# Patient Record
Sex: Male | Born: 1945 | Race: White | Hispanic: No | Smoking: Former smoker
Health system: Southern US, Community
[De-identification: ages and names within clinical notes are randomized; demographics above are authoritative.]

## PROBLEM LIST (undated history)

## (undated) DIAGNOSIS — E785 Hyperlipidemia, unspecified: Secondary | ICD-10-CM

## (undated) DIAGNOSIS — I428 Other cardiomyopathies: Secondary | ICD-10-CM

## (undated) DIAGNOSIS — I519 Heart disease, unspecified: Secondary | ICD-10-CM

## (undated) DIAGNOSIS — I10 Essential (primary) hypertension: Secondary | ICD-10-CM

## (undated) DIAGNOSIS — I442 Atrioventricular block, complete: Secondary | ICD-10-CM

## (undated) HISTORY — DX: Essential (primary) hypertension: I10

## (undated) HISTORY — DX: Heart disease, unspecified: I51.9

## (undated) HISTORY — DX: Hyperlipidemia, unspecified: E78.5

## (undated) HISTORY — DX: Atrioventricular block, complete: I44.2

## (undated) HISTORY — DX: Other cardiomyopathies: I42.8

---

## 2000-01-20 HISTORY — PX: PACEMAKER IMPLANT: EP1218

## 2010-12-16 HISTORY — PX: PACEMAKER GENERATOR CHANGE: SHX5998

## 2015-01-31 DIAGNOSIS — E785 Hyperlipidemia, unspecified: Secondary | ICD-10-CM | POA: Insufficient documentation

## 2015-01-31 DIAGNOSIS — Z45018 Encounter for adjustment and management of other part of cardiac pacemaker: Secondary | ICD-10-CM

## 2015-01-31 DIAGNOSIS — I442 Atrioventricular block, complete: Secondary | ICD-10-CM | POA: Insufficient documentation

## 2015-01-31 DIAGNOSIS — I10 Essential (primary) hypertension: Secondary | ICD-10-CM

## 2015-01-31 DIAGNOSIS — Z950 Presence of cardiac pacemaker: Secondary | ICD-10-CM | POA: Diagnosis not present

## 2015-01-31 HISTORY — DX: Essential (primary) hypertension: I10

## 2015-01-31 HISTORY — DX: Hyperlipidemia, unspecified: E78.5

## 2015-01-31 HISTORY — DX: Encounter for adjustment and management of other part of cardiac pacemaker: Z45.018

## 2015-02-28 DIAGNOSIS — Z45010 Encounter for checking and testing of cardiac pacemaker pulse generator [battery]: Secondary | ICD-10-CM | POA: Diagnosis not present

## 2015-04-23 DIAGNOSIS — Z9181 History of falling: Secondary | ICD-10-CM | POA: Diagnosis not present

## 2015-04-23 DIAGNOSIS — Z6828 Body mass index (BMI) 28.0-28.9, adult: Secondary | ICD-10-CM | POA: Diagnosis not present

## 2015-04-23 DIAGNOSIS — Z950 Presence of cardiac pacemaker: Secondary | ICD-10-CM | POA: Diagnosis not present

## 2015-04-23 DIAGNOSIS — I10 Essential (primary) hypertension: Secondary | ICD-10-CM | POA: Diagnosis not present

## 2015-04-23 DIAGNOSIS — M1990 Unspecified osteoarthritis, unspecified site: Secondary | ICD-10-CM | POA: Diagnosis not present

## 2015-04-23 DIAGNOSIS — M8580 Other specified disorders of bone density and structure, unspecified site: Secondary | ICD-10-CM | POA: Diagnosis not present

## 2015-04-23 DIAGNOSIS — E559 Vitamin D deficiency, unspecified: Secondary | ICD-10-CM | POA: Diagnosis not present

## 2015-04-23 DIAGNOSIS — E663 Overweight: Secondary | ICD-10-CM | POA: Diagnosis not present

## 2015-04-23 DIAGNOSIS — E785 Hyperlipidemia, unspecified: Secondary | ICD-10-CM | POA: Diagnosis not present

## 2015-05-22 DIAGNOSIS — Z950 Presence of cardiac pacemaker: Secondary | ICD-10-CM | POA: Diagnosis not present

## 2015-05-27 DIAGNOSIS — K5730 Diverticulosis of large intestine without perforation or abscess without bleeding: Secondary | ICD-10-CM | POA: Diagnosis not present

## 2015-05-27 DIAGNOSIS — K635 Polyp of colon: Secondary | ICD-10-CM | POA: Diagnosis not present

## 2015-05-27 DIAGNOSIS — Z1211 Encounter for screening for malignant neoplasm of colon: Secondary | ICD-10-CM | POA: Diagnosis not present

## 2015-05-27 DIAGNOSIS — D124 Benign neoplasm of descending colon: Secondary | ICD-10-CM | POA: Diagnosis not present

## 2015-08-22 DIAGNOSIS — Z950 Presence of cardiac pacemaker: Secondary | ICD-10-CM | POA: Diagnosis not present

## 2015-09-09 DIAGNOSIS — H2513 Age-related nuclear cataract, bilateral: Secondary | ICD-10-CM | POA: Diagnosis not present

## 2015-10-23 DIAGNOSIS — Z139 Encounter for screening, unspecified: Secondary | ICD-10-CM | POA: Diagnosis not present

## 2015-10-23 DIAGNOSIS — Z79899 Other long term (current) drug therapy: Secondary | ICD-10-CM | POA: Diagnosis not present

## 2015-10-23 DIAGNOSIS — E785 Hyperlipidemia, unspecified: Secondary | ICD-10-CM | POA: Diagnosis not present

## 2015-10-23 DIAGNOSIS — Z0000 Encounter for general adult medical examination without abnormal findings: Secondary | ICD-10-CM | POA: Diagnosis not present

## 2015-10-23 DIAGNOSIS — Z125 Encounter for screening for malignant neoplasm of prostate: Secondary | ICD-10-CM | POA: Diagnosis not present

## 2015-10-23 DIAGNOSIS — M1990 Unspecified osteoarthritis, unspecified site: Secondary | ICD-10-CM | POA: Diagnosis not present

## 2015-10-23 DIAGNOSIS — Z950 Presence of cardiac pacemaker: Secondary | ICD-10-CM | POA: Diagnosis not present

## 2015-10-23 DIAGNOSIS — Z6827 Body mass index (BMI) 27.0-27.9, adult: Secondary | ICD-10-CM | POA: Diagnosis not present

## 2015-10-23 DIAGNOSIS — E559 Vitamin D deficiency, unspecified: Secondary | ICD-10-CM | POA: Diagnosis not present

## 2015-10-23 DIAGNOSIS — Z23 Encounter for immunization: Secondary | ICD-10-CM | POA: Diagnosis not present

## 2015-10-23 DIAGNOSIS — I10 Essential (primary) hypertension: Secondary | ICD-10-CM | POA: Diagnosis not present

## 2016-02-17 DIAGNOSIS — I10 Essential (primary) hypertension: Secondary | ICD-10-CM | POA: Diagnosis not present

## 2016-02-17 DIAGNOSIS — Z45018 Encounter for adjustment and management of other part of cardiac pacemaker: Secondary | ICD-10-CM | POA: Diagnosis not present

## 2016-02-17 DIAGNOSIS — E785 Hyperlipidemia, unspecified: Secondary | ICD-10-CM | POA: Diagnosis not present

## 2016-02-17 DIAGNOSIS — I442 Atrioventricular block, complete: Secondary | ICD-10-CM | POA: Diagnosis not present

## 2016-02-20 DIAGNOSIS — Z950 Presence of cardiac pacemaker: Secondary | ICD-10-CM | POA: Diagnosis not present

## 2016-02-27 DIAGNOSIS — E785 Hyperlipidemia, unspecified: Secondary | ICD-10-CM | POA: Diagnosis not present

## 2016-02-27 DIAGNOSIS — E663 Overweight: Secondary | ICD-10-CM | POA: Diagnosis not present

## 2016-02-27 DIAGNOSIS — M8580 Other specified disorders of bone density and structure, unspecified site: Secondary | ICD-10-CM | POA: Diagnosis not present

## 2016-02-27 DIAGNOSIS — Z950 Presence of cardiac pacemaker: Secondary | ICD-10-CM | POA: Diagnosis not present

## 2016-02-27 DIAGNOSIS — M1990 Unspecified osteoarthritis, unspecified site: Secondary | ICD-10-CM | POA: Diagnosis not present

## 2016-02-27 DIAGNOSIS — Z6828 Body mass index (BMI) 28.0-28.9, adult: Secondary | ICD-10-CM | POA: Diagnosis not present

## 2016-02-27 DIAGNOSIS — E559 Vitamin D deficiency, unspecified: Secondary | ICD-10-CM | POA: Diagnosis not present

## 2016-02-27 DIAGNOSIS — I10 Essential (primary) hypertension: Secondary | ICD-10-CM | POA: Diagnosis not present

## 2016-02-27 DIAGNOSIS — R739 Hyperglycemia, unspecified: Secondary | ICD-10-CM | POA: Diagnosis not present

## 2016-03-12 DIAGNOSIS — I442 Atrioventricular block, complete: Secondary | ICD-10-CM | POA: Diagnosis not present

## 2016-03-23 DIAGNOSIS — I442 Atrioventricular block, complete: Secondary | ICD-10-CM | POA: Diagnosis not present

## 2016-03-26 DIAGNOSIS — I420 Dilated cardiomyopathy: Secondary | ICD-10-CM | POA: Insufficient documentation

## 2016-03-26 HISTORY — DX: Dilated cardiomyopathy: I42.0

## 2016-04-02 DIAGNOSIS — I459 Conduction disorder, unspecified: Secondary | ICD-10-CM | POA: Diagnosis not present

## 2016-04-02 DIAGNOSIS — I420 Dilated cardiomyopathy: Secondary | ICD-10-CM | POA: Diagnosis not present

## 2016-04-06 DIAGNOSIS — Z45018 Encounter for adjustment and management of other part of cardiac pacemaker: Secondary | ICD-10-CM | POA: Diagnosis not present

## 2016-04-06 DIAGNOSIS — I420 Dilated cardiomyopathy: Secondary | ICD-10-CM | POA: Diagnosis not present

## 2016-04-06 DIAGNOSIS — I442 Atrioventricular block, complete: Secondary | ICD-10-CM | POA: Diagnosis not present

## 2016-04-06 DIAGNOSIS — I10 Essential (primary) hypertension: Secondary | ICD-10-CM | POA: Diagnosis not present

## 2016-04-15 DIAGNOSIS — I420 Dilated cardiomyopathy: Secondary | ICD-10-CM | POA: Diagnosis not present

## 2016-04-15 DIAGNOSIS — I442 Atrioventricular block, complete: Secondary | ICD-10-CM | POA: Diagnosis not present

## 2016-04-15 DIAGNOSIS — E785 Hyperlipidemia, unspecified: Secondary | ICD-10-CM | POA: Diagnosis not present

## 2016-04-15 DIAGNOSIS — Z45018 Encounter for adjustment and management of other part of cardiac pacemaker: Secondary | ICD-10-CM | POA: Diagnosis not present

## 2016-04-15 DIAGNOSIS — I10 Essential (primary) hypertension: Secondary | ICD-10-CM | POA: Diagnosis not present

## 2016-04-20 DIAGNOSIS — I420 Dilated cardiomyopathy: Secondary | ICD-10-CM | POA: Diagnosis not present

## 2016-04-22 DIAGNOSIS — I442 Atrioventricular block, complete: Secondary | ICD-10-CM | POA: Diagnosis not present

## 2016-04-22 DIAGNOSIS — I420 Dilated cardiomyopathy: Secondary | ICD-10-CM | POA: Diagnosis not present

## 2016-04-28 DIAGNOSIS — I442 Atrioventricular block, complete: Secondary | ICD-10-CM | POA: Diagnosis not present

## 2016-04-28 DIAGNOSIS — I340 Nonrheumatic mitral (valve) insufficiency: Secondary | ICD-10-CM | POA: Diagnosis not present

## 2016-04-28 DIAGNOSIS — I517 Cardiomegaly: Secondary | ICD-10-CM | POA: Diagnosis not present

## 2016-04-28 DIAGNOSIS — I5030 Unspecified diastolic (congestive) heart failure: Secondary | ICD-10-CM | POA: Diagnosis not present

## 2016-04-28 DIAGNOSIS — I420 Dilated cardiomyopathy: Secondary | ICD-10-CM | POA: Diagnosis not present

## 2016-05-21 DIAGNOSIS — Z950 Presence of cardiac pacemaker: Secondary | ICD-10-CM | POA: Diagnosis not present

## 2016-06-22 DIAGNOSIS — Z45018 Encounter for adjustment and management of other part of cardiac pacemaker: Secondary | ICD-10-CM | POA: Diagnosis not present

## 2016-06-22 DIAGNOSIS — I420 Dilated cardiomyopathy: Secondary | ICD-10-CM | POA: Diagnosis not present

## 2016-06-22 DIAGNOSIS — I442 Atrioventricular block, complete: Secondary | ICD-10-CM | POA: Diagnosis not present

## 2016-06-22 DIAGNOSIS — E785 Hyperlipidemia, unspecified: Secondary | ICD-10-CM | POA: Diagnosis not present

## 2016-06-22 DIAGNOSIS — I10 Essential (primary) hypertension: Secondary | ICD-10-CM | POA: Diagnosis not present

## 2016-06-29 DIAGNOSIS — E785 Hyperlipidemia, unspecified: Secondary | ICD-10-CM | POA: Diagnosis not present

## 2016-06-29 DIAGNOSIS — Z950 Presence of cardiac pacemaker: Secondary | ICD-10-CM | POA: Diagnosis not present

## 2016-06-29 DIAGNOSIS — Z1389 Encounter for screening for other disorder: Secondary | ICD-10-CM | POA: Diagnosis not present

## 2016-06-29 DIAGNOSIS — Z6827 Body mass index (BMI) 27.0-27.9, adult: Secondary | ICD-10-CM | POA: Diagnosis not present

## 2016-06-29 DIAGNOSIS — I10 Essential (primary) hypertension: Secondary | ICD-10-CM | POA: Diagnosis not present

## 2016-06-29 DIAGNOSIS — Z79899 Other long term (current) drug therapy: Secondary | ICD-10-CM | POA: Diagnosis not present

## 2016-06-29 DIAGNOSIS — E559 Vitamin D deficiency, unspecified: Secondary | ICD-10-CM | POA: Diagnosis not present

## 2016-06-29 DIAGNOSIS — M8580 Other specified disorders of bone density and structure, unspecified site: Secondary | ICD-10-CM | POA: Diagnosis not present

## 2016-06-29 DIAGNOSIS — E663 Overweight: Secondary | ICD-10-CM | POA: Diagnosis not present

## 2016-06-29 DIAGNOSIS — Z9181 History of falling: Secondary | ICD-10-CM | POA: Diagnosis not present

## 2016-10-14 ENCOUNTER — Ambulatory Visit (INDEPENDENT_AMBULATORY_CARE_PROVIDER_SITE_OTHER): Payer: PPO | Admitting: Cardiology

## 2016-10-14 ENCOUNTER — Encounter: Payer: Self-pay | Admitting: Cardiology

## 2016-10-14 VITALS — BP 150/90 | HR 60 | Resp 12 | Ht 71.0 in | Wt 187.0 lb

## 2016-10-14 DIAGNOSIS — E785 Hyperlipidemia, unspecified: Secondary | ICD-10-CM | POA: Diagnosis not present

## 2016-10-14 DIAGNOSIS — I10 Essential (primary) hypertension: Secondary | ICD-10-CM

## 2016-10-14 DIAGNOSIS — I420 Dilated cardiomyopathy: Secondary | ICD-10-CM

## 2016-10-14 DIAGNOSIS — I442 Atrioventricular block, complete: Secondary | ICD-10-CM | POA: Diagnosis not present

## 2016-10-14 DIAGNOSIS — I081 Rheumatic disorders of both mitral and tricuspid valves: Secondary | ICD-10-CM | POA: Diagnosis not present

## 2016-10-14 DIAGNOSIS — I425 Other restrictive cardiomyopathy: Secondary | ICD-10-CM | POA: Diagnosis not present

## 2016-10-14 NOTE — Progress Notes (Signed)
Cardiology Office Note:    Date:  10/14/2016   ID:  Alvin Lang, DOB 05-23-1945, MRN [id number]  PCP:  Nicholos Johns, MD  Cardiologist:  Jenne Campus, MD    Referring MD: Nicholos Johns, MD   Chief Complaint  Patient presents with  . Second opinion on ICD placement  he is doing great asymptomatic  History of Present Illness:    Alvin Lang is a 71 y.o. male  That have been taking care of for last few years. Recently his echo cardiac M showed diminished ejection fraction. Apparently was repeated and showed ejection fraction of 25%. He started having discussion about potentially biventricular pacing or defibrillator.He would like to see me back to get second opinion regarding that issue. 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 No chest pain no tightness no pressure burning in the chest, no shortness of breath, no dizziness no syncope.  Past Medical History:  Diagnosis Date  . Hyperlipidemia   . Hypertension     Past Surgical History:  Procedure Laterality Date  . INSERT / REPLACE / REMOVE PACEMAKER     [employer] Jude    Current Medications: Current Meds  Medication Sig  . aspirin EC 81 MG tablet Take 81 mg by mouth daily.  . Calcium Carb-Cholecalciferol (CALCIUM-VITAMIN D) 500-200 MG-UNIT tablet Take 1 tablet by mouth 2 (two) times daily.  . carvedilol (COREG) 6.25 MG tablet Take 6.25 mg by mouth 2 (two) times daily.  . DOCOSAHEXAENOIC ACID PO Take 2 g by mouth daily.  . ramipril (ALTACE) 10 MG capsule Take 10 mg by mouth daily.  . ramipril (ALTACE) 5 MG capsule Take 5 mg by mouth daily.     Allergies:   Patient has no known allergies.   Social History   Social History  . Marital status: Married    Spouse name: N/A  . Number of children: N/A  . Years of education: N/A   Social History Main Topics  . Smoking status: Former Research scientist (life sciences)  . Smokeless  tobacco: Never Used  . Alcohol use No  . Drug use: No  . Sexual activity: Not Asked   Other Topics Concern  . None   Social History Narrative  . None     Family History: The patient's family history includes Diabetes in his father; Heart attack in his mother; Heart disease in his father. ROS:   Please see the history of present illness.    All 14 point review of systems negative except as described per history of present illness  EKGs/Labs/Other Studies Reviewed:      Recent Labs: No results found for requested labs within last 8760 hours.  Recent Lipid Panel No results found for: CHOL, TRIG, HDL, CHOLHDL, VLDL, LDLCALC, LDLDIRECT  Physical Exam:    VS:  BP (!) 150/90   Pulse 60   Resp 12   Ht 5\' 11"  (1.803 m)   Wt 187 lb (84.8 kg)   BMI 26.08 kg/m     Wt Readings from Last 3 Encounters:  10/14/16 187 lb (84.8 kg)     GEN:  Well nourished, well developed in no acute distress HEENT: Normal NECK: No JVD; No carotid bruits LYMPHATICS: No lymphadenopathy CARDIAC: RRR, no murmurs, no rubs, no gallops RESPIRATORY:  Clear to auscultation without rales, wheezing or rhonchi  ABDOMEN: Soft, non-tender, non-distended MUSCULOSKELETAL:  No edema; No  deformity  SKIN: Warm and dry LOWER EXTREMITIES: no swelling NEUROLOGIC:  Alert and oriented x 3 PSYCHIATRIC:  Normal affect   ASSESSMENT:    1. Dilated cardiomyopathy ([place])   2. Complete heart block ([place])   3. Dyslipidemia   4. Essential hypertension    PLAN:    In order of problems listed above:  1. Dilated coronary myopath He is on  Inhibitor as well as beta blocker. I will check his Chem-7 today To see if I increased dose of ACE inhibitor. I will ask him to have echocardiogram to reassess his left ventricular ejection fraction. Iritis spoke to him about defibrillator, biventricular pacing and he is agreeable to proceed that way they be needs to do that. I still think there is some role to improve his medical  therapy. He can benefit from Aldactone and I will put him on it and based on the results of his Chem-7. 2. Complete heat block: Addressed with dual-chamber pacemaker 3. Essential hypertension: Blood pressure well-controlled. 4. Dyslipidemia: On statin which I'll continue  We'll check Chem-7 as well as proBNP and echocardiogram today.   Medication Adjustments/Labs and Tests Ordered: Current medicines are reviewed at length with the patient today.  Concerns regarding medicines are outlined above.  Orders Placed This Encounter  Procedures  . Basic metabolic panel  . Pro b natriuretic peptide (BNP)  . ECHOCARDIOGRAM COMPLETE   Medication changes: No orders of the defined types were placed in this encounter.   Signed, Park Liter, MD, [facility] 10/14/2016 11:40 AM    [place]

## 2016-10-14 NOTE — Patient Instructions (Addendum)
Medication Instructions:  Your physician recommends that you continue on your current medications as directed. Please refer to the Current Medication list given to you today.  Labwork: Your physician recommends that you have lab work today to check your kidney function and fluid level.   Testing/Procedures: Your physician has requested that you have an echocardiogram. Echocardiography is a painless test that uses sound waves to create images of your heart. It provides your doctor with information about the size and shape of your heart and how well your heart's chambers and valves are working. This procedure takes approximately one hour. There are no restrictions for this procedure.   Follow-Up: Your physician recommends that you schedule a follow-up appointment in: 2-3 weeks  Any Other Special Instructions Will Be Listed Below (If Applicable).  Please note that any paperwork needing to be filled out by the provider will need to be addressed at the front desk prior to seeing the provider. Please note that any paperwork FMLA, Disability or other documents regarding health condition is subject to a $25.00 charge that must be received prior to completion of paperwork in the form of a money order or check.    If you need a refill on your cardiac medications before your next appointment, please call your pharmacy.

## 2016-10-15 ENCOUNTER — Other Ambulatory Visit: Payer: Self-pay

## 2016-10-15 DIAGNOSIS — I420 Dilated cardiomyopathy: Secondary | ICD-10-CM

## 2016-10-15 LAB — BASIC METABOLIC PANEL
BUN/Creatinine Ratio: 17 (ref 10–24)
BUN: 15 mg/dL (ref 8–27)
CO2: 23 mmol/L (ref 20–29)
CREATININE: 0.86 mg/dL (ref 0.76–1.27)
Calcium: 9.2 mg/dL (ref 8.6–10.2)
Chloride: 104 mmol/L (ref 96–106)
GFR, EST AFRICAN AMERICAN: 99 mL/min/{1.73_m2} (ref >59)
GFR, EST NON AFRICAN AMERICAN: 86 mL/min/{1.73_m2} (ref >59)
Glucose: 103 mg/dL — ABNORMAL HIGH (ref 65–99)
Potassium: 4.4 mmol/L (ref 3.5–5.2)
SODIUM: 141 mmol/L (ref 134–144)

## 2016-10-15 LAB — PRO B NATRIURETIC PEPTIDE: NT-Pro BNP: 365 pg/mL (ref 0–376)

## 2016-10-15 MED ORDER — CARVEDILOL 12.5 MG PO TABS: 12.5000 mg | ORAL_TABLET | Freq: Two times a day (BID) | ORAL | 11 refills | Status: DC

## 2016-10-29 DIAGNOSIS — Z6828 Body mass index (BMI) 28.0-28.9, adult: Secondary | ICD-10-CM | POA: Diagnosis not present

## 2016-10-29 DIAGNOSIS — Z139 Encounter for screening, unspecified: Secondary | ICD-10-CM | POA: Diagnosis not present

## 2016-10-29 DIAGNOSIS — Z23 Encounter for immunization: Secondary | ICD-10-CM | POA: Diagnosis not present

## 2016-10-29 DIAGNOSIS — Z950 Presence of cardiac pacemaker: Secondary | ICD-10-CM | POA: Diagnosis not present

## 2016-10-29 DIAGNOSIS — I10 Essential (primary) hypertension: Secondary | ICD-10-CM | POA: Diagnosis not present

## 2016-10-29 DIAGNOSIS — Z79899 Other long term (current) drug therapy: Secondary | ICD-10-CM | POA: Diagnosis not present

## 2016-10-29 DIAGNOSIS — E559 Vitamin D deficiency, unspecified: Secondary | ICD-10-CM | POA: Diagnosis not present

## 2016-10-29 DIAGNOSIS — E785 Hyperlipidemia, unspecified: Secondary | ICD-10-CM | POA: Diagnosis not present

## 2016-10-29 DIAGNOSIS — Z9181 History of falling: Secondary | ICD-10-CM | POA: Diagnosis not present

## 2016-10-29 DIAGNOSIS — E663 Overweight: Secondary | ICD-10-CM | POA: Diagnosis not present

## 2016-11-09 ENCOUNTER — Encounter: Payer: Self-pay | Admitting: Cardiology

## 2016-11-09 ENCOUNTER — Ambulatory Visit (INDEPENDENT_AMBULATORY_CARE_PROVIDER_SITE_OTHER): Payer: PPO | Admitting: Cardiology

## 2016-11-09 ENCOUNTER — Other Ambulatory Visit: Payer: Self-pay

## 2016-11-09 VITALS — BP 160/80 | HR 56 | Resp 12 | Ht 71.0 in | Wt 187.0 lb

## 2016-11-09 DIAGNOSIS — Z45018 Encounter for adjustment and management of other part of cardiac pacemaker: Secondary | ICD-10-CM

## 2016-11-09 DIAGNOSIS — I442 Atrioventricular block, complete: Secondary | ICD-10-CM

## 2016-11-09 DIAGNOSIS — E785 Hyperlipidemia, unspecified: Secondary | ICD-10-CM

## 2016-11-09 DIAGNOSIS — I10 Essential (primary) hypertension: Secondary | ICD-10-CM | POA: Diagnosis not present

## 2016-11-09 DIAGNOSIS — I420 Dilated cardiomyopathy: Secondary | ICD-10-CM

## 2016-11-09 MED ORDER — RAMIPRIL 10 MG PO CAPS: 10.0000 mg | ORAL_CAPSULE | Freq: Every day | ORAL | 3 refills | Status: DC

## 2016-11-09 MED ORDER — SPIRONOLACTONE 25 MG PO TABS: 12.5000 mg | ORAL_TABLET | Freq: Every day | ORAL | 11 refills | Status: DC

## 2016-11-09 NOTE — Progress Notes (Signed)
Cardiology Office Note:    Date:  11/09/2016   ID:  Alvin Lang, DOB [birth year], MRN [id number]  PCP:  Nicholos Johns, MD  Cardiologist:  Jenne Campus, MD    Referring MD: Nicholos Johns, MD   Chief Complaint  Patient presents with  . 3 week follow up  I am doing well  History of Present Illness:    Alvin Lang is a 71 y.o. male  with recently recognize significant cardiomyopathy. He was offered ICD with biventricular pacing but is reluctant to do it he came to me for second opinion that told him that biventricular pacer with ICD will be reasonable thing to do but he wanted to try medication first to see if he can improve his left ventricular ejection fraction. Recently I increased the dose of beta blocker. Today I will discontinue 5 mg of ramipril at evening time and put him on 12.5 mg of Aldactone. We'll check his Chem-7 within next week. I will also interrogate his device today to make sure there is no significant arrhythmia. I seen back in my office about one month and at that time we'll repeat his echocardiogram. If there is no improvement then we'll talk about replacing his device to BiV ICD.  Past Medical History:  Diagnosis Date  . Hyperlipidemia   . Hypertension     Past Surgical History:  Procedure Laterality Date  . INSERT / REPLACE / REMOVE PACEMAKER     [employer]. Jude    Current Medications: Current Meds  Medication Sig  . aspirin EC 81 MG tablet Take 81 mg by mouth daily.  . Calcium Carb-Cholecalciferol (CALCIUM-VITAMIN D) 500-200 MG-UNIT tablet Take 1 tablet by mouth 2 (two) times daily.  . carvedilol (COREG) 12.5 MG tablet Take 1 tablet (12.5 mg total) by mouth 2 (two) times daily.  . DOCOSAHEXAENOIC ACID PO Take 2 g by mouth daily.  . ramipril (ALTACE) 10 MG capsule Take 10 mg by mouth daily.  . ramipril (ALTACE) 5 MG capsule Take 5 mg by mouth daily.  . Vitamin D, Ergocalciferol, (DRISDOL) 50000 units CAPS capsule Take 50,000 Units by mouth every 7 (seven)  days.     Allergies:   Patient has no known allergies.   Social History   Social History  . Marital status: Married    Spouse name: N/A  . Number of children: N/A  . Years of education: N/A   Social History Main Topics  . Smoking status: Former Research scientist (life sciences)  . Smokeless tobacco: Never Used  . Alcohol use No  . Drug use: No  . Sexual activity: Not Asked   Other Topics Concern  . None   Social History Narrative  . None     Family History: The patient's family history includes Diabetes in his father; Heart attack in his mother; Heart disease in his father. ROS:   Please see the history of present illness.    All 14 point review of systems negative except as described per history of present illness  EKGs/Labs/Other Studies Reviewed:      Recent Labs: 10/14/2016: BUN 15; Creatinine, Ser 0.86; NT-Pro BNP 365; Potassium 4.4; Sodium 141  Recent Lipid Panel No results found for: CHOL, TRIG, HDL, CHOLHDL, VLDL, LDLCALC, LDLDIRECT  Physical Exam:    VS:  BP (!) 160/80   Pulse (!) 56   Resp 12   Ht 5\' 11"  (1.803 m)   Wt 187 lb (84.8 kg)   BMI 26.08 kg/m     Wt Readings  from Last 3 Encounters:  11/09/16 187 lb (84.8 kg)  10/14/16 187 lb (84.8 kg)     GEN:  Well nourished, well developed in no acute distress HEENT: Normal NECK: No JVD; No carotid bruits LYMPHATICS: No lymphadenopathy CARDIAC: RRR, no murmurs, no rubs, no gallops RESPIRATORY:  Clear to auscultation without rales, wheezing or rhonchi  ABDOMEN: Soft, non-tender, non-distended MUSCULOSKELETAL:  No edema; No deformity  SKIN: Warm and dry LOWER EXTREMITIES: no swelling NEUROLOGIC:  Alert and oriented x 3 PSYCHIATRIC:  Normal affect   ASSESSMENT:    1. Complete heart block ([place])   2. Dilated cardiomyopathy ([place])   3. Essential hypertension   4. Dyslipidemia   5. Pacemaker reprogramming/check    PLAN:    In order of problems listed above:  1. Complete heart block: He does have a  pacemaker 2. Dilated cardio myopathy: Intensifying his medical therapy 3. Essential hypertension: Blood pressure is elevated today but hopefully with medications will improve 4. Dyslipidemia: Continue with statin 5. Pacemaker will recheck today for any arrhythmias  Interrogation of the pacemaker revealed no evidence of fast ventricular events.   Medication Adjustments/Labs and Tests Ordered: Current medicines are reviewed at length with the patient today.  Concerns regarding medicines are outlined above.  No orders of the defined types were placed in this encounter.  Medication changes: No orders of the defined types were placed in this encounter.   Signed, Park Liter, MD, [facility] 11/09/2016 10:04 AM    [place]

## 2016-11-09 NOTE — Patient Instructions (Addendum)
Medication Instructions:  Your physician has recommended you make the following change in your medication:  1.) STOP Ramipril 5 mg in the evening  and only take Ramipril 10 mg once daily.  2.) START Aldactone 12.5 mg daily.   1. Avoid all over-the-counter antihistamines except Claritin/Loratadine and Zyrtec/Cetrizine. 2. Avoid all combination including cold sinus allergies flu decongestant and sleep medications 3. You can use Robitussin DM Mucinex and Mucinex DM for cough. 4. can use Tylenol aspirin ibuprofen and naproxen but no combinations such as sleep or sinus.  Labwork: Your physician recommends that you return for lab work in: 1 weeks  Testing/Procedures: Environmental manager in office today.   Follow-Up: Your physician recommends that you schedule a follow-up appointment in: 1 month  Any Other Special Instructions Will Be Listed Below (If Applicable).  Please note that any paperwork needing to be filled out by the provider will need to be addressed at the front desk prior to seeing the provider. Please note that any paperwork FMLA, Disability or other documents regarding health condition is subject to a $25.00 charge that must be received prior to completion of paperwork in the form of a money order or check.     If you need a refill on your cardiac medications before your next appointment, please call your pharmacy.

## 2016-11-10 DIAGNOSIS — I420 Dilated cardiomyopathy: Secondary | ICD-10-CM | POA: Diagnosis not present

## 2016-11-10 DIAGNOSIS — I442 Atrioventricular block, complete: Secondary | ICD-10-CM | POA: Diagnosis not present

## 2016-11-10 DIAGNOSIS — E785 Hyperlipidemia, unspecified: Secondary | ICD-10-CM | POA: Diagnosis not present

## 2016-11-10 LAB — BASIC METABOLIC PANEL
BUN / CREAT RATIO: 17 (ref 10–24)
BUN: 15 mg/dL (ref 8–27)
CO2: 23 mmol/L (ref 20–29)
CREATININE: 0.87 mg/dL (ref 0.76–1.27)
Calcium: 9.2 mg/dL (ref 8.6–10.2)
Chloride: 103 mmol/L (ref 96–106)
GFR, EST AFRICAN AMERICAN: 99 mL/min/{1.73_m2} (ref >59)
GFR, EST NON AFRICAN AMERICAN: 86 mL/min/{1.73_m2} (ref >59)
Glucose: 88 mg/dL (ref 65–99)
Potassium: 4.2 mmol/L (ref 3.5–5.2)
Sodium: 138 mmol/L (ref 134–144)

## 2016-11-17 ENCOUNTER — Ambulatory Visit (INDEPENDENT_AMBULATORY_CARE_PROVIDER_SITE_OTHER): Payer: PPO | Admitting: Cardiology

## 2016-11-17 ENCOUNTER — Encounter: Payer: Self-pay | Admitting: Cardiology

## 2016-11-17 ENCOUNTER — Ambulatory Visit (HOSPITAL_BASED_OUTPATIENT_CLINIC_OR_DEPARTMENT_OTHER)
Admission: RE | Admit: 2016-11-17 | Discharge: 2016-11-17 | Disposition: A | Discharge status: Home / Self Care | Payer: PPO | Source: Ambulatory Visit | Attending: Cardiology | Admitting: Cardiology

## 2016-11-17 VITALS — BP 174/94 | HR 56 | Resp 12 | Ht 71.0 in | Wt 186.0 lb

## 2016-11-17 DIAGNOSIS — I420 Dilated cardiomyopathy: Secondary | ICD-10-CM | POA: Diagnosis not present

## 2016-11-17 DIAGNOSIS — E785 Hyperlipidemia, unspecified: Secondary | ICD-10-CM | POA: Diagnosis not present

## 2016-11-17 DIAGNOSIS — I10 Essential (primary) hypertension: Secondary | ICD-10-CM

## 2016-11-17 DIAGNOSIS — I442 Atrioventricular block, complete: Secondary | ICD-10-CM | POA: Diagnosis not present

## 2016-11-17 DIAGNOSIS — I081 Rheumatic disorders of both mitral and tricuspid valves: Secondary | ICD-10-CM | POA: Insufficient documentation

## 2016-11-17 MED ORDER — CARVEDILOL 25 MG PO TABS: 25.0000 mg | ORAL_TABLET | Freq: Two times a day (BID) | ORAL | 1 refills | Status: DC

## 2016-11-17 NOTE — Patient Instructions (Signed)
Medication Instructions:  Your physician has recommended you make the following change in your medication: Your Carvedilol has been increased to 25 mg twice daily. This has been sent your pharmacy.  Labwork: None ordered  Testing/Procedures: None ordered  Follow-Up: Your physician recommends that you schedule a follow-up appointment in: 1 month with Dr. Agustin Cree  You will be contacted by EP to see Dr. Curt Bears.   Any Other Special Instructions Will Be Listed Below (If Applicable).     If you need a refill on your cardiac medications before your next appointment, please call your pharmacy.

## 2016-11-17 NOTE — Progress Notes (Signed)
Cardiology Office Note:    Date:  11/17/2016   ID:  Alvin Lang, DOB [birth year], MRN [id number]  PCP:  Nicholos Johns, MD  Cardiologist:  Jenne Campus, MD    Referring MD: Nicholos Johns, MD   Chief Complaint  Patient presents with  . Follow up Echo  To talk about echocardiogram  History of Present Illness:    Alvin Lang is a 71 y.o. 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  He is coming today to my office for follow-up.  He already had discussion about potentially doing biventricular pacing with potentially defibrillator.  I improved his medication with hopes to improve his left ventricular ejection fraction but in spite of medication being appropriate his ejection fraction still below 35%.  On top of that looking at echocardiogram I do not see anything of the myocardium and I clearly can see asynchronicity of LV.  We spent all visit today talking about options and the situation I think we reached the point that some intervention need to be done.  Basically facing to options of upgrading his dual-chamber pacemaker to biventricular pacing with hope of improvement in his left ventricular ejection fraction to about 35%.  And if that is the case defibrillator will not be needed.  That will involve putting extra LV lead.  Alternative to do so option would be to put defibrillator with biventricular pacing.  In this situation will be forced to put extra ventricular lead for defibrillator as well as a LV lead.  I will refer him to EP team for consultation to help transfer discretion and proceed if patient elected to do so.  So far he is telling me that he is willing to do it and he is favoring defibrillator with extra LV lead and extra defibrillator lead.  His blood pressure is elevated today, therefore I will double the dose of carvedilol.  Past Medical History:  Diagnosis Date  .  Hyperlipidemia   . Hypertension     Past Surgical History:  Procedure Laterality Date  . INSERT / REPLACE / REMOVE PACEMAKER     [employer] Jude    Current Medications: Current Meds  Medication Sig  . aspirin EC 81 MG tablet Take 81 mg by mouth daily.  . Calcium Carb-Cholecalciferol (CALCIUM-VITAMIN D) 500-200 MG-UNIT tablet Take 1 tablet by mouth 2 (two) times daily.  . DOCOSAHEXAENOIC ACID PO Take 2 g by mouth daily.  . ramipril (ALTACE) 10 MG capsule Take 1 capsule (10 mg total) by mouth daily.  Marland Kitchen spironolactone (ALDACTONE) 25 MG tablet Take 0.5 tablets (12.5 mg total) by mouth daily.  . Vitamin D, Ergocalciferol, (DRISDOL) 50000 units CAPS capsule Take 50,000 Units by mouth every 7 (seven) days.  . [DISCONTINUED] carvedilol (COREG) 12.5 MG tablet Take 1 tablet (12.5 mg total) by mouth 2 (two) times daily.     Allergies:   Patient has no known allergies.   Social History   Social History  . Marital status: Married    Spouse name: N/A  . Number of children: N/A  . Years of education: N/A   Social History Main Topics  . Smoking status: Former Research scientist (life sciences)  . Smokeless tobacco: Never Used  . Alcohol use No  . Drug use: No  . Sexual activity: Not Asked   Other Topics Concern  . None   Social History Narrative  . None     Family History: The patient's  family history includes Diabetes in his father; Heart attack in his mother; Heart disease in his father. ROS:   Please see the history of present illness.    All 14 point review of systems negative except as described per history of present illness  EKGs/Labs/Other Studies Reviewed:      Recent Labs: 10/14/2016: NT-Pro BNP 365 11/10/2016: BUN 15; Creatinine, Ser 0.87; Potassium 4.2; Sodium 138  Recent Lipid Panel No results found for: CHOL, TRIG, HDL, CHOLHDL, VLDL, LDLCALC, LDLDIRECT  Physical Exam:    VS:  BP (!) 174/94   Pulse (!) 56   Resp 12   Ht 5\' 11"  (1.803 m)   Wt 186 lb (84.4 kg)   BMI 25.94 kg/m     Wt  Readings from Last 3 Encounters:  11/17/16 186 lb (84.4 kg)  11/09/16 187 lb (84.8 kg)  10/14/16 187 lb (84.8 kg)     GEN:  Well nourished, well developed in no acute distress HEENT: Normal NECK: No JVD; No carotid bruits LYMPHATICS: No lymphadenopathy CARDIAC: RRR, no murmurs, no rubs, no gallops RESPIRATORY:  Clear to auscultation without rales, wheezing or rhonchi  ABDOMEN: Soft, non-tender, non-distended MUSCULOSKELETAL:  No edema; No deformity  SKIN: Warm and dry LOWER EXTREMITIES: no swelling NEUROLOGIC:  Alert and oriented x 3 PSYCHIATRIC:  Normal affect   ASSESSMENT:    1. Complete heart block ([place])   2. Dilated cardiomyopathy ([place])   3. Essential hypertension   4. Dyslipidemia    PLAN:    In order of problems listed above:  1. Complete heart block: Addressed with a pacemaker. 2. Dilated cardiomyopathy with ACE inhibitor, beta-blocker, Aldactone.  I will increase the dose of beta-blocker. 3. Essential hypertension: She will be taking care of by increasing dose of beta-blocker. 4. Dyslipidemia: Statin which I continue   Medication Adjustments/Labs and Tests Ordered: Current medicines are reviewed at length with the patient today.  Concerns regarding medicines are outlined above.  No orders of the defined types were placed in this encounter.  Medication changes:  Meds ordered this encounter  Medications  . carvedilol (COREG) 25 MG tablet    Sig: Take 1 tablet (25 mg total) by mouth 2 (two) times daily.    Dispense:  180 tablet    Refill:  1    Signed, Park Liter, MD, [facility] 11/17/2016 11:12 AM    [place]

## 2016-11-17 NOTE — Progress Notes (Signed)
  Echocardiogram 2D Echocardiogram has been performed.  Alvin Lang 11/17/2016, 10:14 AM

## 2016-11-19 DIAGNOSIS — Z45010 Encounter for checking and testing of cardiac pacemaker pulse generator [battery]: Secondary | ICD-10-CM | POA: Diagnosis not present

## 2016-11-23 ENCOUNTER — Ambulatory Visit (HOSPITAL_COMMUNITY)
Admission: RE | Admit: 2016-11-23 | Discharge: 2016-11-23 | Disposition: A | Discharge status: Home / Self Care | Payer: PPO | Source: Ambulatory Visit | Attending: Internal Medicine | Admitting: Internal Medicine

## 2016-11-23 ENCOUNTER — Ambulatory Visit (INDEPENDENT_AMBULATORY_CARE_PROVIDER_SITE_OTHER): Payer: PPO | Admitting: Internal Medicine

## 2016-11-23 ENCOUNTER — Encounter: Payer: Self-pay | Admitting: Internal Medicine

## 2016-11-23 VITALS — BP 162/88 | HR 60 | Ht 71.0 in | Wt 186.8 lb

## 2016-11-23 DIAGNOSIS — I420 Dilated cardiomyopathy: Secondary | ICD-10-CM

## 2016-11-23 DIAGNOSIS — Z45018 Encounter for adjustment and management of other part of cardiac pacemaker: Secondary | ICD-10-CM

## 2016-11-23 DIAGNOSIS — R0602 Shortness of breath: Secondary | ICD-10-CM

## 2016-11-23 DIAGNOSIS — I442 Atrioventricular block, complete: Secondary | ICD-10-CM | POA: Diagnosis not present

## 2016-11-23 NOTE — Patient Instructions (Addendum)
Medication Instructions:  Your physician recommends that you continue on your current medications as directed. Please refer to the Current Medication list given to you today.  -- If you need a refill on your cardiac medications before your next appointment, please call your pharmacy. --  Labwork: None ordered  Testing/Procedures: A chest x-ray takes a picture of the organs and structures inside the chest, including the heart, lungs, and blood vessels. This test can show several things, including, whether the heart is enlarges; whether fluid is building up in the lungs; and whether pacemaker / defibrillator leads are still in place.  Follow-Up: Your physician wants you to follow-up in: 1 year with Dr. Rayann Heman.  You will receive a reminder letter in the mail two months in advance. If you don't receive a letter, please call our office to schedule the follow-up appointment.  Remote monitoring is used to monitor your Pacemaker  from home. This monitoring reduces the number of office visits required to check your device to one time per year. It allows [place] to keep an eye on the functioning of your device to ensure it is working properly. You are scheduled for a device check from home on 02/22/2017. You may send your transmission at any time that day. If you have a wireless device, the transmission will be sent automatically. After your physician reviews your transmission, you will receive a postcard with your next transmission date.    Thank you for choosing [facility]!!   Frederik Schmidt, RN [phone]  Any Other Special Instructions Will Be Listed Below (If Applicable).

## 2016-11-23 NOTE — Progress Notes (Signed)
Electrophysiology Office Note   Date:  11/23/2016   ID:  DEROLD DORSCH, DOB 02/07/1945, MRN [id number]  PCP:  Nicholos Johns, MD  Cardiologist:  Dr Agustin Cree Primary Electrophysiologist: previously Dr Ola Spurr  CC: CHF   History of Present Illness: Alvin Lang is a 71 y.o. male who presents today for electrophysiology evaluation.   The patient is referred by Dr Agustin Cree for EP consultation regarding his pacemaker and concerns for pacemaker mediated cardiomyopathy.  The patient carries a history of complete heart block.  He initially underwent PPM implantation 2002.  He has done well since his pacemaker was implanted. He underwent generator change in 2012.  He has reduced EF but with NYHA Class II symptoms.  He has SOB with moderate activity.  He has most recently been followed by Dr Ola Spurr.  The patient has been treated with good medicines but continues to have a depressed EF.  There have been conversations about upgrade to CRT-D.  The patient presents today for further EP opinion.  He remains reasonably active.  Today, he denies symptoms of palpitations, chest pain,  orthopnea, PND, lower extremity edema, claudication, dizziness, presyncope, syncope, bleeding, or neurologic sequela. The patient is tolerating medications without difficulties and is otherwise without complaint today.    Past Medical History:  Diagnosis Date  . Chronic systolic dysfunction of left ventricle   . Complete heart block ([place])   . Hyperlipidemia   . Hypertension   . Nonischemic cardiomyopathy [facility])    Past Surgical History:  Procedure Laterality Date  . PACEMAKER GENERATOR CHANGE Left 12/16/2010   SJM [facility] DR RF PPM for complete heart block,  generator change by Dr Agustin Cree  . PACEMAKER IMPLANT  2002   by Dr Agustin Cree     Current Outpatient Medications  Medication Sig Dispense Refill  . aspirin EC 81 MG tablet Take 81 mg by mouth daily.    . Calcium Carb-Cholecalciferol  (CALCIUM-VITAMIN D) 500-200 MG-UNIT tablet Take 1 tablet by mouth 2 (two) times daily.    . carvedilol (COREG) 25 MG tablet Take 1 tablet (25 mg total) by mouth 2 (two) times daily. 180 tablet 1  . DOCOSAHEXAENOIC ACID PO Take 2 g by mouth daily.    . ramipril (ALTACE) 10 MG capsule Take 1 capsule (10 mg total) by mouth daily. 90 capsule 3  . spironolactone (ALDACTONE) 25 MG tablet Take 0.5 tablets (12.5 mg total) by mouth daily. 30 tablet 11  . Vitamin D, Ergocalciferol, (DRISDOL) 50000 units CAPS capsule Take 50,000 Units by mouth every 7 (seven) days.     No current facility-administered medications for this visit.     Allergies:   Patient has no known allergies.   Social History:  The patient  reports that he has quit smoking. he has never used smokeless tobacco. He reports that he does not drink alcohol or use drugs.   Family History:  The patient's family history includes Diabetes in his father; Heart attack in his mother; Heart disease in his father.    ROS:  Please see the history of present illness.   All other systems are personally reviewed and negative.    PHYSICAL EXAM: VS:  BP (!) 162/88   Pulse 60   Ht 5\' 11"  (1.803 m)   Wt 186 lb 12.8 oz (84.7 kg)   SpO2 97%   BMI 26.05 kg/m  , BMI Body mass index is 26.05 kg/m.  GEN: Well nourished, well developed, in no acute distress  HEENT: normal  Neck: no JVD, carotid bruits, or masses Cardiac: RRR; no murmurs, rubs, or gallops,no edema  Respiratory:  clear to auscultation bilaterally, normal work of breathing GI: soft, nontender, nondistended, + BS MS: no deformity or atrophy  Skin: warm and dry, device pocket is well healed Neuro:  Strength and sensation are intact Psych: euthymic mood, full affect  EKG:  EKG is ordered today. The ekg ordered today is personally reviewed and shows AV paced rhythm  Prior Echo is reviewed and reveals EF 30% with severe MR  Device interrogation is personally reviewed today in detail.   See PaceArt for details.   Recent Labs: 10/14/2016: NT-Pro BNP 365 11/10/2016: BUN 15; Creatinine, Ser 0.87; Potassium 4.2; Sodium 138  personally reviewed   Lipid Panel  No results found for: CHOL, TRIG, HDL, CHOLHDL, VLDL, LDLCALC, LDLDIRECT personally reviewed   Wt Readings from Last 3 Encounters:  11/23/16 186 lb 12.8 oz (84.7 kg)  11/17/16 186 lb (84.4 kg)  11/09/16 187 lb (84.8 kg)      Other studies Reviewed: Additional studies/ records that were personally reviewed today include: Dr Wendy Poet notes, Dr Blane Ohara prior notes  Review of the above records today demonstrates: as above   ASSESSMENT AND PLAN:  1.  Complete heart block The patient has prior PPM implantation for CHB.  He has depressed EF (30%) despite good medical therapy.  There is concern for pacemaker induced cardiomyopathy as he V paces > 40%.  At this time, he meets criteria for BIV ICD upgrade for primary prevention of sudden death and treatment of his cardiomyopathy.  Risks, benefits, alternatives to BiV ICD upgrade were discussed in detail with the patient today. We did discuss that his risks are likely increased given 2 prior devices, complete heart block, and implant time of 16 years.  I agree with Dr Ola Spurr that further medical optimization is also reasonable.  Given his BP today is elevated, I think that entresto would be a good option for him.  He wishes to discuss this further when he sees Dr Agustin Cree in a few weeks. Will obtain CXR to evaluate current device leads. He will contact my office if he decides to proceed with device upgrade. Otherwise, I will follow remotely and see him back in a year.  2. Nonischemic CM/ chronic systolic dysfunction CXR as above Consider entresto as above  Follow-up:  Merlin Return to see me in a year  Current medicines are reviewed at length with the patient today.   The patient does not have concerns regarding his medicines.  The following changes were  made today:  none  Labs/ tests ordered today include:  Orders Placed This Encounter  Procedures  . EKG 12-Lead     Signed, Thompson Grayer, MD  11/23/2016 9:49 AM     [facility] [address] [place] [employer] [address] [phone] (office) [phone] (fax)

## 2016-11-24 ENCOUNTER — Encounter: Payer: Self-pay | Admitting: Internal Medicine

## 2016-11-24 NOTE — Telephone Encounter (Signed)
This encounter was created in error - please disregard.

## 2016-11-24 NOTE — Telephone Encounter (Signed)
New message ° ° ° ° °Patient returning call for results. Please call °

## 2016-11-26 LAB — CUP PACEART INCLINIC DEVICE CHECK
Brady Statistic RA Percent Paced: 32 %
Implantable Lead Implant Date: 20020201
Implantable Lead Location: 753859
Implantable Pulse Generator Implant Date: 20121127
Lead Channel Impedance Value: 237.5 Ohm
Lead Channel Impedance Value: 800 Ohm
Lead Channel Pacing Threshold Pulse Width: 0.4 ms
Lead Channel Setting Pacing Amplitude: 1.125
Lead Channel Setting Pacing Amplitude: 2 V
Lead Channel Setting Sensing Sensitivity: 5 mV
MDC IDC LEAD IMPLANT DT: 20020201
MDC IDC LEAD LOCATION: 753860
MDC IDC MSMT BATTERY REMAINING LONGEVITY: 73 mo
MDC IDC MSMT BATTERY VOLTAGE: 2.89 V
MDC IDC MSMT LEADCHNL RA PACING THRESHOLD AMPLITUDE: 1 V
MDC IDC MSMT LEADCHNL RA PACING THRESHOLD PULSEWIDTH: 0.4 ms
MDC IDC MSMT LEADCHNL RA SENSING INTR AMPL: 3.2 mV
MDC IDC MSMT LEADCHNL RV PACING THRESHOLD AMPLITUDE: 0.75 V
MDC IDC PG SERIAL: 7288642
MDC IDC SESS DTM: 20181105152313
MDC IDC SET LEADCHNL RV PACING PULSEWIDTH: 0.4 ms
MDC IDC STAT BRADY RV PERCENT PACED: 99.69 %

## 2017-01-22 ENCOUNTER — Ambulatory Visit (INDEPENDENT_AMBULATORY_CARE_PROVIDER_SITE_OTHER): Payer: PPO | Admitting: Cardiology

## 2017-01-22 ENCOUNTER — Encounter: Payer: Self-pay | Admitting: Cardiology

## 2017-01-22 VITALS — BP 130/82 | HR 51 | Ht 71.0 in | Wt 187.8 lb

## 2017-01-22 DIAGNOSIS — I10 Essential (primary) hypertension: Secondary | ICD-10-CM

## 2017-01-22 DIAGNOSIS — I442 Atrioventricular block, complete: Secondary | ICD-10-CM | POA: Diagnosis not present

## 2017-01-22 DIAGNOSIS — I420 Dilated cardiomyopathy: Secondary | ICD-10-CM

## 2017-01-22 DIAGNOSIS — E785 Hyperlipidemia, unspecified: Secondary | ICD-10-CM

## 2017-01-22 MED ORDER — SACUBITRIL-VALSARTAN 24-26 MG PO TABS: 1.0000 | ORAL_TABLET | Freq: Two times a day (BID) | ORAL | 1 refills | Status: DC

## 2017-01-22 NOTE — Patient Instructions (Signed)
Medication Instructions:  Your physician has recommended you make the following change in your medication:  1) Stop your Ramipril 2) After stopping your Ramipril for 3 days start on your Entresto 1 tablet twice daily  Labwork: None ordered  Testing/Procedures: None ordered  Follow-Up: Your physician recommends that you schedule a follow-up appointment in: 1 month with Dr. Agustin Cree   Any Other Special Instructions Will Be Listed Below (If Applicable).     If you need a refill on your cardiac medications before your next appointment, please call your pharmacy.

## 2017-01-22 NOTE — Addendum Note (Signed)
Addended by: Mattie Marlin on: 01/22/2017 09:19 AM   Modules accepted: Orders

## 2017-01-22 NOTE — Progress Notes (Signed)
Cardiology Office Note:    Date:  01/22/2017   ID:  Alvin Lang, DOB 12/31/1945, MRN [id number]  PCP:  Alvin Johns, MD  Cardiologist:  Alvin Campus, MD    Referring MD: Alvin Johns, MD   Chief Complaint  Patient presents with  . 1 month follow up  I am doing well  History of Present Illness:    Alvin Lang is a 72 y.o. male with history of cardiomyopathy which is nonischemic.  We contemplating upgrading his device and pacing system to by V pacing with ICD.  Apparently he initially saw Dr. Ola Lang to however wanted second opinion came back to me.  I referred him to our EP team.  He did see Dr. Rayann Lang who recommended by V pacing with ICD however patient is very reluctant and actually understanding that the patient had is the fact that he is high risk of having this procedure.  t he is not keen on proceeding with a biventricular pacing with ICD.  Therefore, we will try to switch him to [facility] and increase the dose to appropriate 1 and see if there be any improvement left ventricular ejection fraction.  I asked him to stop ramipril 3 days later to start [facility].  Back in my office in 1 month  Past Medical History:  Diagnosis Date  . Chronic systolic dysfunction of left ventricle   . Complete heart block ([place])   . Hyperlipidemia   . Hypertension   . Nonischemic cardiomyopathy [facility])     Past Surgical History:  Procedure Laterality Date  . PACEMAKER GENERATOR CHANGE Left 12/16/2010   SJM [facility] DR RF PPM for complete heart block,  generator change by Dr Alvin Lang  . PACEMAKER IMPLANT  2002   by Dr Alvin Lang    Current Medications: Current Meds  Medication Sig  . aspirin EC 81 MG tablet Take 81 mg by mouth daily.  Marland Kitchen atorvastatin (LIPITOR) 10 MG tablet Take 10 mg by mouth daily.  . Calcium Carb-Cholecalciferol (CALCIUM-VITAMIN D) 500-200 MG-UNIT tablet Take 1 tablet by mouth 2 (two) times daily.  . carvedilol (COREG) 25 MG tablet Take 1 tablet (25 mg total) by  mouth 2 (two) times daily.  . DOCOSAHEXAENOIC ACID PO Take 2 g by mouth daily.  . ramipril (ALTACE) 10 MG capsule Take 1 capsule (10 mg total) by mouth daily.  Marland Kitchen spironolactone (ALDACTONE) 25 MG tablet Take 0.5 tablets (12.5 mg total) by mouth daily.  . Vitamin D, Ergocalciferol, (DRISDOL) 50000 units CAPS capsule Take 50,000 Units by mouth every 7 (seven) days.     Allergies:   Patient has no known allergies.   Social History   Socioeconomic History  . Marital status: Married    Spouse name: None  . Number of children: None  . Years of education: None  . Highest education level: None  Social Needs  . Financial resource strain: None  . Food insecurity - worry: None  . Food insecurity - inability: None  . Transportation needs - medical: None  . Transportation needs - non-medical: None  Occupational History  . None  Tobacco Use  . Smoking status: Former Research scientist (life sciences)  . Smokeless tobacco: Never Used  Substance and Sexual Activity  . Alcohol use: No  . Drug use: No  . Sexual activity: None  Other Topics Concern  . None  Social History Narrative  . None     Family History: The patient's family history includes Diabetes in his father; Heart attack in his mother;  Heart disease in his father. ROS:   Please see the history of present illness.    All 14 point review of systems negative except as described per history of present illness  EKGs/Labs/Other Studies Reviewed:      Recent Labs: 10/14/2016: NT-Pro BNP 365 11/10/2016: BUN 15; Creatinine, Ser 0.87; Potassium 4.2; Sodium 138  Recent Lipid Panel No results found for: CHOL, TRIG, HDL, CHOLHDL, VLDL, LDLCALC, LDLDIRECT  Physical Exam:    VS:  BP 130/82   Pulse (!) 51   Ht 5\' 11"  (1.803 m)   Wt 187 lb 12.8 oz (85.2 kg)   SpO2 98%   BMI 26.19 kg/m     Wt Readings from Last 3 Encounters:  01/22/17 187 lb 12.8 oz (85.2 kg)  11/23/16 186 lb 12.8 oz (84.7 kg)  11/17/16 186 lb (84.4 kg)     GEN:  Well nourished, well  developed in no acute distress HEENT: Normal NECK: No JVD; No carotid bruits LYMPHATICS: No lymphadenopathy CARDIAC: RRR, no murmurs, no rubs, no gallops RESPIRATORY:  Clear to auscultation without rales, wheezing or rhonchi  ABDOMEN: Soft, non-tender, non-distended MUSCULOSKELETAL:  No edema; No deformity  SKIN: Warm and dry LOWER EXTREMITIES: no swelling NEUROLOGIC:  Alert and oriented x 3 PSYCHIATRIC:  Normal affect   ASSESSMENT:    1. Complete heart block ([place])   2. Dilated cardiomyopathy ([place])   3. Essential hypertension   4. Dyslipidemia    PLAN:    In order of problems listed above:  1. Elevated cardiomyopathy: Will discontinue ACE inhibitor and start on Entresto. 2. Complete heart block: That is being addressed with a pacemaker. 3. Essential hypertension: Well controlled. 4. Dyslipidemia: Renew present management   Medication Adjustments/Labs and Tests Ordered: Current medicines are reviewed at length with the patient today.  Concerns regarding medicines are outlined above.  No orders of the defined types were placed in this encounter.  Medication changes: No orders of the defined types were placed in this encounter.   Signed, Alvin Liter, MD, [facility] 01/22/2017 9:07 AM    [place]

## 2017-02-01 ENCOUNTER — Telehealth: Payer: Self-pay | Admitting: Cardiology

## 2017-02-01 NOTE — Telephone Encounter (Signed)
Left message on machine per DPR. Advised that a prior authorization for Alvin Lang is in process. Advised that if he needs samples in the meantime to call ahead before coming her to make sure we have the correct dose for him. Advised to call with any further questions or concerns.

## 2017-02-01 NOTE — Telephone Encounter (Signed)
Is having trouble getting his Alvin Lang

## 2017-02-05 ENCOUNTER — Telehealth: Payer: Self-pay | Admitting: Cardiology

## 2017-02-05 NOTE — Telephone Encounter (Signed)
Patient aware to pick up samples ? ?

## 2017-02-05 NOTE — Telephone Encounter (Signed)
Want samples of Entresto

## 2017-02-22 ENCOUNTER — Ambulatory Visit: Payer: PPO | Admitting: Cardiology

## 2017-02-22 ENCOUNTER — Ambulatory Visit (INDEPENDENT_AMBULATORY_CARE_PROVIDER_SITE_OTHER): Payer: PPO | Admitting: Cardiology

## 2017-02-22 ENCOUNTER — Ambulatory Visit (INDEPENDENT_AMBULATORY_CARE_PROVIDER_SITE_OTHER): Payer: PPO | Admitting: *Deleted

## 2017-02-22 ENCOUNTER — Encounter: Payer: Self-pay | Admitting: Cardiology

## 2017-02-22 VITALS — BP 158/88 | HR 62 | Ht 71.0 in | Wt 187.0 lb

## 2017-02-22 DIAGNOSIS — I420 Dilated cardiomyopathy: Secondary | ICD-10-CM | POA: Diagnosis not present

## 2017-02-22 DIAGNOSIS — I442 Atrioventricular block, complete: Secondary | ICD-10-CM | POA: Diagnosis not present

## 2017-02-22 DIAGNOSIS — E785 Hyperlipidemia, unspecified: Secondary | ICD-10-CM | POA: Diagnosis not present

## 2017-02-22 DIAGNOSIS — I10 Essential (primary) hypertension: Secondary | ICD-10-CM | POA: Diagnosis not present

## 2017-02-22 NOTE — Progress Notes (Signed)
Cardiology Office Note:    Date:  02/22/2017   ID:  JSIAH MENTA, DOB 1945/04/15, MRN [id number]  PCP:  Nicholos Johns, MD  Cardiologist:  Jenne Campus, MD    Referring MD: Nicholos Johns, MD   Chief Complaint  Patient presents with  . Follow-up    Entresto  Well  History of Present Illness:    Alvin Lang is a 72 y.o. male with cardiomyopathy which is nonischemic in origin.  He does have significant diminished left ventricular ejection fraction conversation about ICD and by V pacing were initiated.  However he still does not want to do this.  Therefore I try to maximize his medical therapy and hopefully by doing that improving his left ventricular ejection fraction.  He is doing well denies have any chest pain tightness squeezing pressure burning chest.  We will check Chem-7 today if Chem-7 is fine I will double the dose of Entresto and I will bring him back to my office in about 2 months to recheck his left ventricular ejection fraction if there is still no improvement then will press hard on doing a ICD with by V pacing.  Past Medical History:  Diagnosis Date  . Chronic systolic dysfunction of left ventricle   . Complete heart block ([place])   . Hyperlipidemia   . Hypertension   . Nonischemic cardiomyopathy [facility])     Past Surgical History:  Procedure Laterality Date  . PACEMAKER GENERATOR CHANGE Left 12/16/2010   SJM [facility] DR RF PPM for complete heart block,  generator change by Dr Agustin Cree  . PACEMAKER IMPLANT  2002   by Dr Agustin Cree    Current Medications: Current Meds  Medication Sig  . aspirin EC 81 MG tablet Take 81 mg by mouth daily.  Marland Kitchen atorvastatin (LIPITOR) 10 MG tablet Take 10 mg by mouth daily.  . Calcium Carb-Cholecalciferol (CALCIUM-VITAMIN D) 500-200 MG-UNIT tablet Take 1 tablet by mouth 2 (two) times daily.  . DOCOSAHEXAENOIC ACID PO Take 2 g by mouth daily.  . sacubitril-valsartan (ENTRESTO) 24-26 MG Take 1 tablet by mouth 2 (two) times daily.  Marland Kitchen  spironolactone (ALDACTONE) 25 MG tablet Take 0.5 tablets (12.5 mg total) by mouth daily.  . Vitamin D, Ergocalciferol, (DRISDOL) 50000 units CAPS capsule Take 50,000 Units by mouth every 7 (seven) days.     Allergies:   Patient has no known allergies.   Social History   Socioeconomic History  . Marital status: Married    Spouse name: None  . Number of children: None  . Years of education: None  . Highest education level: None  Social Needs  . Financial resource strain: None  . Food insecurity - worry: None  . Food insecurity - inability: None  . Transportation needs - medical: None  . Transportation needs - non-medical: None  Occupational History  . None  Tobacco Use  . Smoking status: Former Research scientist (life sciences)  . Smokeless tobacco: Never Used  Substance and Sexual Activity  . Alcohol use: No  . Drug use: No  . Sexual activity: None  Other Topics Concern  . None  Social History Narrative  . None     Family History: The patient's family history includes Diabetes in his father; Heart attack in his mother; Heart disease in his father. ROS:   Please see the history of present illness.    All 14 point review of systems negative except as described per history of present illness  EKGs/Labs/Other Studies Reviewed:  Recent Labs: 10/14/2016: NT-Pro BNP 365 11/10/2016: BUN 15; Creatinine, Ser 0.87; Potassium 4.2; Sodium 138  Recent Lipid Panel No results found for: CHOL, TRIG, HDL, CHOLHDL, VLDL, LDLCALC, LDLDIRECT  Physical Exam:    VS:  BP (!) 158/88 (BP Location: Right Arm, Patient Position: Sitting, Cuff Size: Normal)   Pulse 62   Ht 5\' 11"  (1.803 m)   Wt 187 lb (84.8 kg)   SpO2 97%   BMI 26.08 kg/m     Wt Readings from Last 3 Encounters:  02/22/17 187 lb (84.8 kg)  01/22/17 187 lb 12.8 oz (85.2 kg)  11/23/16 186 lb 12.8 oz (84.7 kg)     GEN:  Well nourished, well developed in no acute distress HEENT: Normal NECK: No JVD; No carotid bruits LYMPHATICS: No  lymphadenopathy CARDIAC: RRR, no murmurs, no rubs, no gallops RESPIRATORY:  Clear to auscultation without rales, wheezing or rhonchi  ABDOMEN: Soft, non-tender, non-distended MUSCULOSKELETAL:  No edema; No deformity  SKIN: Warm and dry LOWER EXTREMITIES: no swelling NEUROLOGIC:  Alert and oriented x 3 PSYCHIATRIC:  Normal affect   ASSESSMENT:    1. Complete heart block ([place])   2. Dilated cardiomyopathy ([place])   3. Essential hypertension   4. Dyslipidemia    PLAN:    In order of problems listed above:  1. Cardia myopathy which is nonischemic: On beta-blocker and Entresto and Aldactone will continue we will check Chem-7. 2. Complete heart block with pacemaker will continue following. 3. Essential hypertension: We will try to increase dose of Entresto. 4. Dyslipidemia continue with statin   Medication Adjustments/Labs and Tests Ordered: Current medicines are reviewed at length with the patient today.  Concerns regarding medicines are outlined above.  No orders of the defined types were placed in this encounter.  Medication changes: No orders of the defined types were placed in this encounter.   Signed, Alvin Liter, MD, [facility] 02/22/2017 4:46 PM    [place]

## 2017-02-22 NOTE — Progress Notes (Signed)
Remote pacemaker transmission.   

## 2017-02-22 NOTE — Patient Instructions (Signed)
Medication Instructions:  Your physician recommends that you continue on your current medications as directed. Please refer to the Current Medication list given to you today.  Labwork: Your physician recommends that you have lab work today: BMP  Testing/Procedures: None ordered  Follow-Up: Your physician recommends that you schedule a follow-up appointment in: 2 monyhs with Dr. Agustin Cree   Any Other Special Instructions Will Be Listed Below (If Applicable).     If you need a refill on your cardiac medications before your next appointment, please call your pharmacy.

## 2017-02-22 NOTE — Addendum Note (Signed)
Addended by: Aleatha Borer on: 02/22/2017 04:50 PM   Modules accepted: Orders

## 2017-02-23 ENCOUNTER — Encounter: Payer: Self-pay | Admitting: Cardiology

## 2017-02-23 LAB — BASIC METABOLIC PANEL
BUN / CREAT RATIO: 16 (ref 10–24)
BUN: 14 mg/dL (ref 8–27)
CHLORIDE: 102 mmol/L (ref 96–106)
CO2: 22 mmol/L (ref 20–29)
CREATININE: 0.9 mg/dL (ref 0.76–1.27)
Calcium: 9.3 mg/dL (ref 8.6–10.2)
GFR calc Af Amer: 98 mL/min/{1.73_m2} (ref >59)
GFR calc non Af Amer: 84 mL/min/{1.73_m2} (ref >59)
GLUCOSE: 96 mg/dL (ref 65–99)
Potassium: 4.4 mmol/L (ref 3.5–5.2)
SODIUM: 140 mmol/L (ref 134–144)

## 2017-02-25 ENCOUNTER — Telehealth: Payer: Self-pay

## 2017-02-25 DIAGNOSIS — I420 Dilated cardiomyopathy: Secondary | ICD-10-CM

## 2017-02-25 NOTE — Telephone Encounter (Signed)
Patient informed of results of lab work. Advised patient to double the dose of Entresto. Patient just picked up a 90 day supply of 24-26 mg. Advised patient to take 2 tablets in the AM and 2 tablets in PM, until he runs out, then we will send in 49-51 mg. Patient verbalized understanding. Patient is going to have lab work at [facility] in [place] in 5 days. Patient verbalized understanding. No further questions.

## 2017-02-25 NOTE — Telephone Encounter (Signed)
-----   Message from Park Liter, MD sent at 02/23/2017 10:29 AM EST ----- chem7 looks good, doblr thr dose of Entresto, chem7 in 5 days

## 2017-03-03 DIAGNOSIS — I420 Dilated cardiomyopathy: Secondary | ICD-10-CM | POA: Diagnosis not present

## 2017-03-03 LAB — BASIC METABOLIC PANEL
BUN / CREAT RATIO: 18 (ref 10–24)
BUN: 15 mg/dL (ref 8–27)
CO2: 23 mmol/L (ref 20–29)
Calcium: 9.1 mg/dL (ref 8.6–10.2)
Chloride: 104 mmol/L (ref 96–106)
Creatinine, Ser: 0.83 mg/dL (ref 0.76–1.27)
GFR calc non Af Amer: 87 mL/min/{1.73_m2} (ref >59)
GFR, EST AFRICAN AMERICAN: 101 mL/min/{1.73_m2} (ref >59)
Glucose: 94 mg/dL (ref 65–99)
Potassium: 4.2 mmol/L (ref 3.5–5.2)
Sodium: 138 mmol/L (ref 134–144)

## 2017-03-04 DIAGNOSIS — Z79899 Other long term (current) drug therapy: Secondary | ICD-10-CM | POA: Diagnosis not present

## 2017-03-04 DIAGNOSIS — E785 Hyperlipidemia, unspecified: Secondary | ICD-10-CM | POA: Diagnosis not present

## 2017-03-04 DIAGNOSIS — Z125 Encounter for screening for malignant neoplasm of prostate: Secondary | ICD-10-CM | POA: Diagnosis not present

## 2017-03-04 DIAGNOSIS — I10 Essential (primary) hypertension: Secondary | ICD-10-CM | POA: Diagnosis not present

## 2017-03-04 DIAGNOSIS — Z6828 Body mass index (BMI) 28.0-28.9, adult: Secondary | ICD-10-CM | POA: Diagnosis not present

## 2017-03-04 DIAGNOSIS — E559 Vitamin D deficiency, unspecified: Secondary | ICD-10-CM | POA: Diagnosis not present

## 2017-03-04 DIAGNOSIS — E663 Overweight: Secondary | ICD-10-CM | POA: Diagnosis not present

## 2017-03-04 DIAGNOSIS — Z950 Presence of cardiac pacemaker: Secondary | ICD-10-CM | POA: Diagnosis not present

## 2017-03-04 DIAGNOSIS — M8580 Other specified disorders of bone density and structure, unspecified site: Secondary | ICD-10-CM | POA: Diagnosis not present

## 2017-03-20 LAB — CUP PACEART REMOTE DEVICE CHECK
Battery Remaining Longevity: 69 mo
Battery Remaining Percentage: 57 %
Brady Statistic AP VS Percent: 1 %
Brady Statistic AS VP Percent: 62 %
Brady Statistic AS VS Percent: 1 %
Date Time Interrogation Session: 20190204083920
Implantable Lead Implant Date: 20020201
Implantable Lead Location: 753860
Lead Channel Impedance Value: 210 Ohm
Lead Channel Pacing Threshold Amplitude: 0.375 V
Lead Channel Pacing Threshold Pulse Width: 0.4 ms
Lead Channel Sensing Intrinsic Amplitude: 3.6 mV
Lead Channel Setting Pacing Amplitude: 2 V
Lead Channel Setting Pacing Pulse Width: 0.4 ms
MDC IDC LEAD IMPLANT DT: 20020201
MDC IDC LEAD LOCATION: 753859
MDC IDC MSMT BATTERY VOLTAGE: 2.89 V
MDC IDC MSMT LEADCHNL RA PACING THRESHOLD AMPLITUDE: 1 V
MDC IDC MSMT LEADCHNL RA PACING THRESHOLD PULSEWIDTH: 0.4 ms
MDC IDC MSMT LEADCHNL RV IMPEDANCE VALUE: 750 Ohm
MDC IDC MSMT LEADCHNL RV SENSING INTR AMPL: 12 mV
MDC IDC PG IMPLANT DT: 20121127
MDC IDC PG SERIAL: 7288642
MDC IDC SET LEADCHNL RV PACING AMPLITUDE: 0.625
MDC IDC SET LEADCHNL RV SENSING SENSITIVITY: 5 mV
MDC IDC STAT BRADY AP VP PERCENT: 38 %
MDC IDC STAT BRADY RA PERCENT PACED: 37 %
MDC IDC STAT BRADY RV PERCENT PACED: 99 %
Pulse Gen Model: 2210

## 2017-04-06 ENCOUNTER — Telehealth: Payer: Self-pay | Admitting: Cardiology

## 2017-04-06 MED ORDER — SACUBITRIL-VALSARTAN 49-51 MG PO TABS: 1.0000 | ORAL_TABLET | Freq: Two times a day (BID) | ORAL | 3 refills | Status: DC

## 2017-04-06 NOTE — Telephone Encounter (Signed)
He takes 4 entresto tablets daily

## 2017-04-06 NOTE — Addendum Note (Signed)
Addended by: Austin Miles on: 04/06/2017 02:21 PM   Modules accepted: Orders

## 2017-04-06 NOTE — Telephone Encounter (Signed)
Patient called to let us know he is almost out of entresto 24-26 mg which he has been taking 2 tablets twice daily since speaking with Michaela in February. New prescription sent in to pharmacy for patient for appropriate dosing 49-51 mg 1 tablet twice daily. Patient verbalized understanding. No further questions.

## 2017-04-23 ENCOUNTER — Ambulatory Visit (INDEPENDENT_AMBULATORY_CARE_PROVIDER_SITE_OTHER): Payer: PPO | Admitting: Cardiology

## 2017-04-23 ENCOUNTER — Encounter: Payer: Self-pay | Admitting: Cardiology

## 2017-04-23 VITALS — BP 138/76 | HR 48 | Ht 71.0 in | Wt 191.0 lb

## 2017-04-23 DIAGNOSIS — I442 Atrioventricular block, complete: Secondary | ICD-10-CM

## 2017-04-23 DIAGNOSIS — E785 Hyperlipidemia, unspecified: Secondary | ICD-10-CM

## 2017-04-23 DIAGNOSIS — I420 Dilated cardiomyopathy: Secondary | ICD-10-CM | POA: Diagnosis not present

## 2017-04-23 DIAGNOSIS — I10 Essential (primary) hypertension: Secondary | ICD-10-CM

## 2017-04-23 NOTE — Patient Instructions (Signed)
Medication Instructions:  Your physician recommends that you continue on your current medications as directed. Please refer to the Current Medication list given to you today.  Labwork: None  Testing/Procedures: You had an EKG today.  Your physician has requested that you have an echocardiogram. Echocardiography is a painless test that uses sound waves to create images of your heart. It provides your doctor with information about the size and shape of your heart and how well your heart's chambers and valves are working. This procedure takes approximately one hour. There are no restrictions for this procedure.  Follow-Up: Your physician wants you to follow-up in: 3 months. You will receive a reminder letter in the mail two months in advance. If you don't receive a letter, please call our office to schedule the follow-up appointment.  Any Other Special Instructions Will Be Listed Below (If Applicable).     If you need a refill on your cardiac medications before your next appointment, please call your pharmacy.   

## 2017-04-23 NOTE — Progress Notes (Signed)
Cardiology Office Note:    Date:  04/23/2017   ID:  Alvin Lang, DOB Mar 05, 1945, MRN [id number]  PCP:  Nicholos Johns, MD  Cardiologist:  Jenne Campus, MD    Referring MD: Nicholos Johns, MD   Chief Complaint  Patient presents with  . 2 month follow up  Doing well  History of Present Illness:    Alvin Lang is a 72 y.o. 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  He is already on appropriate medical therapy stent we will repeat his echocardiogram to recheck left ventricular ejection fraction.  If there is no improvement he is less ventricular ejection fraction will consider upgrading his device to Bi V pacing with defibrillator.  Overall clinically he is doing well he always did well.  Denies having shortness of breath tightness squeezing pressure burning chest still works quite hard and have no difficulty doing this  Past Medical History:  Diagnosis Date  . Chronic systolic dysfunction of left ventricle   . Complete heart block ([place])   . Hyperlipidemia   . Hypertension   . Nonischemic cardiomyopathy [facility])     Past Surgical History:  Procedure Laterality Date  . PACEMAKER GENERATOR CHANGE Left 12/16/2010   SJM Accent DR RF PPM for complete heart block,  generator change by Dr Agustin Cree  . PACEMAKER IMPLANT  2002   by Dr Agustin Cree    Current Medications: Current Meds  Medication Sig  . aspirin EC 81 MG tablet Take 81 mg by mouth daily.  Marland Kitchen atorvastatin (LIPITOR) 10 MG tablet Take 10 mg by mouth daily.  . Calcium Carb-Cholecalciferol (CALCIUM-VITAMIN D) 500-200 MG-UNIT tablet Take 1 tablet by mouth 2 (two) times daily.  . carvedilol (COREG) 25 MG tablet Take 1 tablet (25 mg total) by mouth 2 (two) times daily.  . DOCOSAHEXAENOIC ACID PO Take 2 g by mouth daily.  . sacubitril-valsartan (ENTRESTO) 49-51 MG Take 1 tablet by mouth 2 (two) times daily.  Marland Kitchen spironolactone (ALDACTONE) 25 MG tablet Take 0.5 tablets (12.5 mg  total) by mouth daily.  . Vitamin D, Ergocalciferol, (DRISDOL) 50000 units CAPS capsule Take 50,000 Units by mouth every 7 (seven) days.     Allergies:   Patient has no known allergies.   Social History   Socioeconomic History  . Marital status: Married    Spouse name: Not on file  . Number of children: Not on file  . Years of education: Not on file  . Highest education level: Not on file  Occupational History  . Not on file  Social Needs  . Financial resource strain: Not on file  . Food insecurity:    Worry: Not on file    Inability: Not on file  . Transportation needs:    Medical: Not on file    Non-medical: Not on file  Tobacco Use  . Smoking status: Former Research scientist (life sciences)  . Smokeless tobacco: Never Used  Substance and Sexual Activity  . Alcohol use: No  . Drug use: No  . Sexual activity: Not on file  Lifestyle  . Physical activity:    Days per week: Not on file    Minutes per session: Not on file  . Stress: Not on file  Relationships  . Social connections:    Talks on phone: Not on file    Gets together: Not on file    Attends religious service: Not on file    Active member of club or organization: Not on file  Attends meetings of clubs or organizations: Not on file    Relationship status: Not on file  Other Topics Concern  . Not on file  Social History Narrative  . Not on file     Family History: The patient's family history includes Diabetes in his father; Heart attack in his mother; Heart disease in his father. ROS:   Please see the history of present illness.    All 14 point review of systems negative except as described per history of present illness  EKGs/Labs/Other Studies Reviewed:      Recent Labs: 10/14/2016: NT-Pro BNP 365 03/03/2017: BUN 15; Creatinine, Ser 0.83; Potassium 4.2; Sodium 138  Recent Lipid Panel No results found for: CHOL, TRIG, HDL, CHOLHDL, VLDL, LDLCALC, LDLDIRECT  Physical Exam:    VS:  BP 138/76   Pulse (!) 48   Ht 5\' 11"   (1.803 m)   Wt 191 lb (86.6 kg)   SpO2 98%   BMI 26.64 kg/m     Wt Readings from Last 3 Encounters:  04/23/17 191 lb (86.6 kg)  02/22/17 187 lb (84.8 kg)  01/22/17 187 lb 12.8 oz (85.2 kg)     GEN:  Well nourished, well developed in no acute distress HEENT: Normal NECK: No JVD; No carotid bruits LYMPHATICS: No lymphadenopathy CARDIAC: RRR, no murmurs, no rubs, no gallops RESPIRATORY:  Clear to auscultation without rales, wheezing or rhonchi  ABDOMEN: Soft, non-tender, non-distended MUSCULOSKELETAL:  No edema; No deformity  SKIN: Warm and dry LOWER EXTREMITIES: no swelling NEUROLOGIC:  Alert and oriented x 3 PSYCHIATRIC:  Normal affect   ASSESSMENT:    1. Complete heart block ([place])   2. Dilated cardiomyopathy ([place])   3. Essential hypertension   4. Dyslipidemia    PLAN:    In order of problems listed above:  1. Complete heart block: That being addressed with pacemaker normal function 5 years left in the device. 2. Dilated cardiomyopathy on Entresto beta-blocker Aldactone I will continue.  Echocardiogram will be repeated 3. Essential hypertension: Blood pressure well controlled continue present management. 4. Dyslipidemia: He is on atorvastatin which I will continue  Is measured heart rate was 48 we will do EKG today to check the rhythm.   Medication Adjustments/Labs and Tests Ordered: Current medicines are reviewed at length with the patient today.  Concerns regarding medicines are outlined above.  No orders of the defined types were placed in this encounter.  Medication changes: No orders of the defined types were placed in this encounter.   Signed, Park Liter, MD, [facility] 04/23/2017 9:30 AM    [place]

## 2017-05-04 DIAGNOSIS — I080 Rheumatic disorders of both mitral and aortic valves: Secondary | ICD-10-CM | POA: Diagnosis not present

## 2017-05-04 DIAGNOSIS — I517 Cardiomegaly: Secondary | ICD-10-CM | POA: Diagnosis not present

## 2017-05-04 DIAGNOSIS — I420 Dilated cardiomyopathy: Secondary | ICD-10-CM | POA: Diagnosis not present

## 2017-05-11 ENCOUNTER — Other Ambulatory Visit: Payer: Self-pay | Admitting: *Deleted

## 2017-05-11 DIAGNOSIS — I442 Atrioventricular block, complete: Secondary | ICD-10-CM

## 2017-05-11 DIAGNOSIS — I420 Dilated cardiomyopathy: Secondary | ICD-10-CM

## 2017-05-13 ENCOUNTER — Other Ambulatory Visit: Payer: Self-pay | Admitting: Cardiology

## 2017-05-24 ENCOUNTER — Ambulatory Visit (INDEPENDENT_AMBULATORY_CARE_PROVIDER_SITE_OTHER): Payer: PPO | Admitting: *Deleted

## 2017-05-24 DIAGNOSIS — I442 Atrioventricular block, complete: Secondary | ICD-10-CM

## 2017-05-25 NOTE — Progress Notes (Signed)
Remote pacemaker transmission.   

## 2017-05-26 ENCOUNTER — Encounter: Payer: Self-pay | Admitting: Cardiology

## 2017-05-26 NOTE — Progress Notes (Signed)
Letter  

## 2017-06-07 LAB — CUP PACEART REMOTE DEVICE CHECK
Brady Statistic AP VP Percent: 38 %
Brady Statistic AP VS Percent: 1 %
Brady Statistic AS VS Percent: 1 %
Brady Statistic RV Percent Paced: 99 %
Implantable Lead Location: 753859
Lead Channel Impedance Value: 210 Ohm
Lead Channel Impedance Value: 640 Ohm
Lead Channel Pacing Threshold Amplitude: 0.5 V
Lead Channel Pacing Threshold Amplitude: 1 V
Lead Channel Pacing Threshold Pulse Width: 0.4 ms
Lead Channel Sensing Intrinsic Amplitude: 12 mV
Lead Channel Sensing Intrinsic Amplitude: 3.1 mV
Lead Channel Setting Pacing Amplitude: 2 V
MDC IDC LEAD IMPLANT DT: 20020201
MDC IDC LEAD IMPLANT DT: 20020201
MDC IDC LEAD LOCATION: 753860
MDC IDC MSMT BATTERY REMAINING LONGEVITY: 61 mo
MDC IDC MSMT BATTERY REMAINING PERCENTAGE: 51 %
MDC IDC MSMT BATTERY VOLTAGE: 2.87 V
MDC IDC MSMT LEADCHNL RA PACING THRESHOLD PULSEWIDTH: 0.4 ms
MDC IDC PG IMPLANT DT: 20121127
MDC IDC SESS DTM: 20190506091410
MDC IDC SET LEADCHNL RV PACING AMPLITUDE: 0.75 V
MDC IDC SET LEADCHNL RV PACING PULSEWIDTH: 0.4 ms
MDC IDC SET LEADCHNL RV SENSING SENSITIVITY: 5 mV
MDC IDC STAT BRADY AS VP PERCENT: 62 %
MDC IDC STAT BRADY RA PERCENT PACED: 37 %
Pulse Gen Model: 2210
Pulse Gen Serial Number: 7288642

## 2017-07-01 DIAGNOSIS — I10 Essential (primary) hypertension: Secondary | ICD-10-CM | POA: Diagnosis not present

## 2017-07-01 DIAGNOSIS — M1990 Unspecified osteoarthritis, unspecified site: Secondary | ICD-10-CM | POA: Diagnosis not present

## 2017-07-01 DIAGNOSIS — Z6828 Body mass index (BMI) 28.0-28.9, adult: Secondary | ICD-10-CM | POA: Diagnosis not present

## 2017-07-01 DIAGNOSIS — E663 Overweight: Secondary | ICD-10-CM | POA: Diagnosis not present

## 2017-07-01 DIAGNOSIS — E785 Hyperlipidemia, unspecified: Secondary | ICD-10-CM | POA: Diagnosis not present

## 2017-07-01 DIAGNOSIS — D696 Thrombocytopenia, unspecified: Secondary | ICD-10-CM | POA: Diagnosis not present

## 2017-07-01 DIAGNOSIS — E559 Vitamin D deficiency, unspecified: Secondary | ICD-10-CM | POA: Diagnosis not present

## 2017-07-01 DIAGNOSIS — Z950 Presence of cardiac pacemaker: Secondary | ICD-10-CM | POA: Diagnosis not present

## 2017-08-23 ENCOUNTER — Ambulatory Visit (INDEPENDENT_AMBULATORY_CARE_PROVIDER_SITE_OTHER): Payer: PPO | Admitting: *Deleted

## 2017-08-23 DIAGNOSIS — I442 Atrioventricular block, complete: Secondary | ICD-10-CM

## 2017-08-24 NOTE — Progress Notes (Signed)
Remote pacemaker transmission.   

## 2017-09-21 LAB — CUP PACEART REMOTE DEVICE CHECK
Battery Remaining Longevity: 62 mo
Battery Voltage: 2.87 V
Brady Statistic AP VP Percent: 36 %
Brady Statistic AS VS Percent: 1 %
Brady Statistic RA Percent Paced: 36 %
Brady Statistic RV Percent Paced: 99 %
Date Time Interrogation Session: 20190805074344
Implantable Lead Implant Date: 20020201
Implantable Lead Location: 753859
Implantable Pulse Generator Implant Date: 20121127
Lead Channel Impedance Value: 210 Ohm
Lead Channel Impedance Value: 790 Ohm
Lead Channel Pacing Threshold Amplitude: 1 V
Lead Channel Pacing Threshold Pulse Width: 0.4 ms
Lead Channel Setting Pacing Amplitude: 1.125
Lead Channel Setting Sensing Sensitivity: 5 mV
MDC IDC LEAD IMPLANT DT: 20020201
MDC IDC LEAD LOCATION: 753860
MDC IDC MSMT BATTERY REMAINING PERCENTAGE: 51 %
MDC IDC MSMT LEADCHNL RA SENSING INTR AMPL: 3.5 mV
MDC IDC MSMT LEADCHNL RV PACING THRESHOLD AMPLITUDE: 0.875 V
MDC IDC MSMT LEADCHNL RV PACING THRESHOLD PULSEWIDTH: 0.4 ms
MDC IDC MSMT LEADCHNL RV SENSING INTR AMPL: 12 mV
MDC IDC PG SERIAL: 7288642
MDC IDC SET LEADCHNL RA PACING AMPLITUDE: 2 V
MDC IDC SET LEADCHNL RV PACING PULSEWIDTH: 0.4 ms
MDC IDC STAT BRADY AP VS PERCENT: 1 %
MDC IDC STAT BRADY AS VP PERCENT: 64 %
Pulse Gen Model: 2210

## 2017-09-30 DIAGNOSIS — R739 Hyperglycemia, unspecified: Secondary | ICD-10-CM | POA: Diagnosis not present

## 2017-09-30 DIAGNOSIS — E663 Overweight: Secondary | ICD-10-CM | POA: Diagnosis not present

## 2017-09-30 DIAGNOSIS — E785 Hyperlipidemia, unspecified: Secondary | ICD-10-CM | POA: Diagnosis not present

## 2017-09-30 DIAGNOSIS — D696 Thrombocytopenia, unspecified: Secondary | ICD-10-CM | POA: Diagnosis not present

## 2017-09-30 DIAGNOSIS — I10 Essential (primary) hypertension: Secondary | ICD-10-CM | POA: Diagnosis not present

## 2017-09-30 DIAGNOSIS — Z87442 Personal history of urinary calculi: Secondary | ICD-10-CM | POA: Diagnosis not present

## 2017-09-30 DIAGNOSIS — E559 Vitamin D deficiency, unspecified: Secondary | ICD-10-CM | POA: Diagnosis not present

## 2017-09-30 DIAGNOSIS — H269 Unspecified cataract: Secondary | ICD-10-CM | POA: Diagnosis not present

## 2017-09-30 DIAGNOSIS — Z1339 Encounter for screening examination for other mental health and behavioral disorders: Secondary | ICD-10-CM | POA: Diagnosis not present

## 2017-09-30 DIAGNOSIS — Z950 Presence of cardiac pacemaker: Secondary | ICD-10-CM | POA: Diagnosis not present

## 2017-09-30 DIAGNOSIS — Z79899 Other long term (current) drug therapy: Secondary | ICD-10-CM | POA: Diagnosis not present

## 2017-09-30 DIAGNOSIS — Z6828 Body mass index (BMI) 28.0-28.9, adult: Secondary | ICD-10-CM | POA: Diagnosis not present

## 2017-09-30 DIAGNOSIS — Z1331 Encounter for screening for depression: Secondary | ICD-10-CM | POA: Diagnosis not present

## 2017-11-01 ENCOUNTER — Encounter: Payer: Self-pay | Admitting: Cardiology

## 2017-11-01 ENCOUNTER — Ambulatory Visit (INDEPENDENT_AMBULATORY_CARE_PROVIDER_SITE_OTHER): Payer: PPO | Admitting: Cardiology

## 2017-11-01 VITALS — BP 160/80 | HR 61 | Ht 71.0 in | Wt 188.6 lb

## 2017-11-01 DIAGNOSIS — E785 Hyperlipidemia, unspecified: Secondary | ICD-10-CM | POA: Diagnosis not present

## 2017-11-01 DIAGNOSIS — I420 Dilated cardiomyopathy: Secondary | ICD-10-CM | POA: Diagnosis not present

## 2017-11-01 DIAGNOSIS — I10 Essential (primary) hypertension: Secondary | ICD-10-CM

## 2017-11-01 DIAGNOSIS — I442 Atrioventricular block, complete: Secondary | ICD-10-CM | POA: Diagnosis not present

## 2017-11-01 NOTE — Patient Instructions (Signed)
Medication Instructions:  Your physician recommends that you continue on your current medications as directed. Please refer to the Current Medication list given to you today.  If you need a refill on your cardiac medications before your next appointment, please call your pharmacy.   Lab work: None.  If you have labs (blood work) drawn today and your tests are completely normal, you will receive your results only by: . MyChart Message (if you have MyChart) OR . A paper copy in the mail If you have any lab test that is abnormal or we need to change your treatment, we will call you to review the results.  Testing/Procedures: Your physician has requested that you have an echocardiogram. Echocardiography is a painless test that uses sound waves to create images of your heart. It provides your doctor with information about the size and shape of your heart and how well your heart's chambers and valves are working. This procedure takes approximately one hour. There are no restrictions for this procedure.    Follow-Up: At [facility], you and your health needs are our priority.  As part of our continuing mission to provide you with exceptional heart care, we have created designated Provider Care Teams.  These Care Teams include your primary Cardiologist (physician) and Advanced Practice Providers (APPs -  Physician Assistants and Nurse Practitioners) who all work together to provide you with the care you need, when you need it. You will need a follow up appointment in 6 months.  Please call our office 2 months in advance to schedule this appointment.  You may see Robert Krasowski, MD or another member of our [facility] Provider Team in [place]: Brian Munley, MD . Rajan Revankar, MD  Any Other Special Instructions Will Be Listed Below (If Applicable).  Echocardiogram An echocardiogram, or echocardiography, uses sound waves (ultrasound) to produce an image of your heart. The echocardiogram is  simple, painless, obtained within a short period of time, and offers valuable information to your health care provider. The images from an echocardiogram can provide information such as:  Evidence of coronary artery disease (CAD).  Heart size.  Heart muscle function.  Heart valve function.  Aneurysm detection.  Evidence of a past heart attack.  Fluid buildup around the heart.  Heart muscle thickening.  Assess heart valve function.  Tell a health care provider about:  Any allergies you have.  All medicines you are taking, including vitamins, herbs, eye drops, creams, and over-the-counter medicines.  Any problems you or family members have had with anesthetic medicines.  Any blood disorders you have.  Any surgeries you have had.  Any medical conditions you have.  Whether you are pregnant or may be pregnant. What happens before the procedure? No special preparation is needed. Eat and drink normally. What happens during the procedure?  In order to produce an image of your heart, gel will be applied to your chest and a wand-like tool (transducer) will be moved over your chest. The gel will help transmit the sound waves from the transducer. The sound waves will harmlessly bounce off your heart to allow the heart images to be captured in real-time motion. These images will then be recorded.  You may need an IV to receive a medicine that improves the quality of the pictures. What happens after the procedure? You may return to your normal schedule including diet, activities, and medicines, unless your health care provider tells you otherwise. This information is not intended to replace advice given to you   by your health care provider. Make sure you discuss any questions you have with your health care provider. Document Released: 01/03/2000 Document Revised: 08/24/2015 Document Reviewed: 09/12/2012 Elsevier Interactive Patient Education  2017 Elsevier Inc.    

## 2017-11-01 NOTE — Progress Notes (Signed)
Cardiology Office Note:    Date:  11/01/2017   ID:  Alvin Lang, DOB 06/16/1945, MRN [id number]  PCP:  Nicholos Johns, MD  Cardiologist:  Jenne Campus, MD    Referring MD: Nicholos Johns, MD   No chief complaint on file. Doing very well  History of Present Illness:    Alvin Lang is a 72 y.o. male with cardiomyopathy ejection fraction 30 to 35% however with appropriate guideline directed medical therapy he improved last echocardiogram done in the spring time showed ejection fraction 35-40.  Overall he is doing well no chest pain tightness squeezing pressure burning chest.  I will ask him to have echocardiogram done again to recheck left ventricular ejection fraction in the meantime we will continue with Entresto as well as Coreg and Aldactone.  I told him if ejection fraction deteriorated we need to start document by V pacing with defibrillator.  Past Medical History:  Diagnosis Date  . Chronic systolic dysfunction of left ventricle   . Complete heart block ([place])   . Hyperlipidemia   . Hypertension   . Nonischemic cardiomyopathy [facility])     Past Surgical History:  Procedure Laterality Date  . PACEMAKER GENERATOR CHANGE Left 12/16/2010   SJM Accent DR RF PPM for complete heart block,  generator change by Dr Agustin Cree  . PACEMAKER IMPLANT  2002   by Dr Agustin Cree    Current Medications: Current Meds  Medication Sig  . atorvastatin (LIPITOR) 10 MG tablet Take 10 mg by mouth daily.  . Calcium Carb-Cholecalciferol (CALCIUM-VITAMIN D) 500-200 MG-UNIT tablet Take 1 tablet by mouth 2 (two) times daily.  . carvedilol (COREG) 25 MG tablet TAKE 1 TABLET BY MOUTH TWICE DAILY  . DOCOSAHEXAENOIC ACID PO Take 2 g by mouth daily.  . sacubitril-valsartan (ENTRESTO) 49-51 MG Take 1 tablet by mouth 2 (two) times daily.  Marland Kitchen spironolactone (ALDACTONE) 25 MG tablet Take 0.5 tablets (12.5 mg total) by mouth daily.  . Vitamin D, Ergocalciferol, (DRISDOL) 50000 units CAPS capsule Take 50,000  Units by mouth every 7 (seven) days.     Allergies:   Patient has no known allergies.   Social History   Socioeconomic History  . Marital status: Married    Spouse name: Not on file  . Number of children: Not on file  . Years of education: Not on file  . Highest education level: Not on file  Occupational History  . Not on file  Social Needs  . Financial resource strain: Not on file  . Food insecurity:    Worry: Not on file    Inability: Not on file  . Transportation needs:    Medical: Not on file    Non-medical: Not on file  Tobacco Use  . Smoking status: Former Research scientist (life sciences)  . Smokeless tobacco: Never Used  Substance and Sexual Activity  . Alcohol use: No  . Drug use: No  . Sexual activity: Not on file  Lifestyle  . Physical activity:    Days per week: Not on file    Minutes per session: Not on file  . Stress: Not on file  Relationships  . Social connections:    Talks on phone: Not on file    Gets together: Not on file    Attends religious service: Not on file    Active member of club or organization: Not on file    Attends meetings of clubs or organizations: Not on file    Relationship status: Not on file  Other Topics  Concern  . Not on file  Social History Narrative  . Not on file     Family History: The patient's family history includes Diabetes in his father; Heart attack in his mother; Heart disease in his father. ROS:   Please see the history of present illness.    All 14 point review of systems negative except as described per history of present illness  EKGs/Labs/Other Studies Reviewed:      Recent Labs: 03/03/2017: BUN 15; Creatinine, Ser 0.83; Potassium 4.2; Sodium 138  Recent Lipid Panel No results found for: CHOL, TRIG, HDL, CHOLHDL, VLDL, LDLCALC, LDLDIRECT  Physical Exam:    VS:  BP (!) 160/80   Pulse 61   Ht 5\' 11"  (1.803 m)   Wt 188 lb 9.6 oz (85.5 kg)   SpO2 98%   BMI 26.30 kg/m     Wt Readings from Last 3 Encounters:  11/01/17 188  lb 9.6 oz (85.5 kg)  04/23/17 191 lb (86.6 kg)  02/22/17 187 lb (84.8 kg)     GEN:  Well nourished, well developed in no acute distress HEENT: Normal NECK: No JVD; No carotid bruits LYMPHATICS: No lymphadenopathy CARDIAC: RRR, no murmurs, no rubs, no gallops RESPIRATORY:  Clear to auscultation without rales, wheezing or rhonchi  ABDOMEN: Soft, non-tender, non-distended MUSCULOSKELETAL:  No edema; No deformity  SKIN: Warm and dry LOWER EXTREMITIES: no swelling NEUROLOGIC:  Alert and oriented x 3 PSYCHIATRIC:  Normal affect   ASSESSMENT:    1. Dilated cardiomyopathy ([place])   2. Essential hypertension   3. Complete heart block ([place])   4. Dyslipidemia    PLAN:    In order of problems listed above:  1. Dilated cardiomyopathy on guideline directed medical therapy.  Clinically doing very well echocardiogram will be verified. 2. Essential hypertension his blood pressure is always good at home today is elevated in the office. 3. Complete heart block.  To address with the pacemaker that being followed by a our EP team in [place]. 4. Dyslipidemia just recently had fasting lipid profile done apparently everything looks good.  See him back in my office in about 6 months or sooner if his echocardiogram showed diminished ejection fraction.   Medication Adjustments/Labs and Tests Ordered: Current medicines are reviewed at length with the patient today.  Concerns regarding medicines are outlined above.  No orders of the defined types were placed in this encounter.  Medication changes: No orders of the defined types were placed in this encounter.   Signed, Park Liter, MD, [facility] 11/01/2017 8:24 AM    [place]

## 2017-11-10 DIAGNOSIS — Z23 Encounter for immunization: Secondary | ICD-10-CM | POA: Diagnosis not present

## 2017-11-22 ENCOUNTER — Ambulatory Visit (INDEPENDENT_AMBULATORY_CARE_PROVIDER_SITE_OTHER): Payer: PPO | Admitting: *Deleted

## 2017-11-22 DIAGNOSIS — I442 Atrioventricular block, complete: Secondary | ICD-10-CM | POA: Diagnosis not present

## 2017-11-22 NOTE — Progress Notes (Signed)
Remote pacemaker transmission.   

## 2017-11-25 ENCOUNTER — Encounter: Payer: Self-pay | Admitting: Cardiology

## 2017-12-21 ENCOUNTER — Ambulatory Visit (INDEPENDENT_AMBULATORY_CARE_PROVIDER_SITE_OTHER): Payer: PPO

## 2017-12-21 DIAGNOSIS — I420 Dilated cardiomyopathy: Secondary | ICD-10-CM

## 2017-12-21 NOTE — Progress Notes (Signed)
Complete echocardiogram has been performed.  Jimmy Anistyn Graddy RDCS, RVT 

## 2017-12-22 DIAGNOSIS — H25813 Combined forms of age-related cataract, bilateral: Secondary | ICD-10-CM | POA: Diagnosis not present

## 2017-12-22 DIAGNOSIS — H353131 Nonexudative age-related macular degeneration, bilateral, early dry stage: Secondary | ICD-10-CM | POA: Diagnosis not present

## 2017-12-22 DIAGNOSIS — H33321 Round hole, right eye: Secondary | ICD-10-CM | POA: Diagnosis not present

## 2017-12-27 ENCOUNTER — Telehealth: Payer: Self-pay | Admitting: *Deleted

## 2017-12-27 NOTE — Telephone Encounter (Signed)
Patient informed of results per Dr. Geraldo Pitter. Also informed him that Dr. Agustin Cree will review results when he returns and we will call with any updates. He verbally [employer]

## 2017-12-27 NOTE — Telephone Encounter (Signed)
Pt phoned wanting results of echocardiogram. Let pt know that someone had attempted to call him but was unable to reach him. Informed pt that I would send a message to the nurse to please call pt back.

## 2018-01-04 ENCOUNTER — Telehealth: Payer: Self-pay | Admitting: Cardiology

## 2018-01-04 DIAGNOSIS — E559 Vitamin D deficiency, unspecified: Secondary | ICD-10-CM | POA: Diagnosis not present

## 2018-01-04 DIAGNOSIS — I10 Essential (primary) hypertension: Secondary | ICD-10-CM | POA: Diagnosis not present

## 2018-01-04 DIAGNOSIS — E663 Overweight: Secondary | ICD-10-CM | POA: Diagnosis not present

## 2018-01-04 DIAGNOSIS — Z9181 History of falling: Secondary | ICD-10-CM | POA: Diagnosis not present

## 2018-01-04 DIAGNOSIS — Z01818 Encounter for other preprocedural examination: Secondary | ICD-10-CM | POA: Diagnosis not present

## 2018-01-04 DIAGNOSIS — J309 Allergic rhinitis, unspecified: Secondary | ICD-10-CM | POA: Diagnosis not present

## 2018-01-04 DIAGNOSIS — Z139 Encounter for screening, unspecified: Secondary | ICD-10-CM | POA: Diagnosis not present

## 2018-01-04 DIAGNOSIS — H269 Unspecified cataract: Secondary | ICD-10-CM | POA: Diagnosis not present

## 2018-01-04 DIAGNOSIS — Z6828 Body mass index (BMI) 28.0-28.9, adult: Secondary | ICD-10-CM | POA: Diagnosis not present

## 2018-01-04 DIAGNOSIS — E785 Hyperlipidemia, unspecified: Secondary | ICD-10-CM | POA: Diagnosis not present

## 2018-01-04 NOTE — Telephone Encounter (Signed)
Wants clearance letter faxed to doctor Uppin

## 2018-01-04 NOTE — Telephone Encounter (Signed)
Called patient back. No answer. Will continue efforts.

## 2018-01-05 NOTE — Telephone Encounter (Signed)
Patient reports his eye surgery isn't until Feb 2020. He will have office send the clearance form for [place] to sign.

## 2018-01-23 LAB — CUP PACEART REMOTE DEVICE CHECK
Battery Remaining Longevity: 54 mo
Battery Remaining Percentage: 46 %
Brady Statistic AP VS Percent: 1 %
Brady Statistic AS VP Percent: 64 %
Brady Statistic AS VS Percent: 1 %
Brady Statistic RV Percent Paced: 99 %
Date Time Interrogation Session: 20191104061712
Implantable Lead Implant Date: 20020201
Implantable Lead Implant Date: 20020201
Implantable Lead Location: 753860
Implantable Pulse Generator Implant Date: 20121127
Lead Channel Impedance Value: 210 Ohm
Lead Channel Pacing Threshold Amplitude: 0.75 V
Lead Channel Pacing Threshold Pulse Width: 0.4 ms
Lead Channel Sensing Intrinsic Amplitude: 12 mV
Lead Channel Sensing Intrinsic Amplitude: 3.5 mV
Lead Channel Setting Pacing Amplitude: 1 V
Lead Channel Setting Pacing Pulse Width: 0.4 ms
Lead Channel Setting Sensing Sensitivity: 5 mV
MDC IDC LEAD LOCATION: 753859
MDC IDC MSMT BATTERY VOLTAGE: 2.86 V
MDC IDC MSMT LEADCHNL RA PACING THRESHOLD AMPLITUDE: 1 V
MDC IDC MSMT LEADCHNL RA PACING THRESHOLD PULSEWIDTH: 0.4 ms
MDC IDC MSMT LEADCHNL RV IMPEDANCE VALUE: 600 Ohm
MDC IDC PG SERIAL: 7288642
MDC IDC SET LEADCHNL RA PACING AMPLITUDE: 2 V
MDC IDC STAT BRADY AP VP PERCENT: 35 %
MDC IDC STAT BRADY RA PERCENT PACED: 35 %

## 2018-02-10 ENCOUNTER — Other Ambulatory Visit: Payer: Self-pay | Admitting: Cardiology

## 2018-02-21 ENCOUNTER — Ambulatory Visit (INDEPENDENT_AMBULATORY_CARE_PROVIDER_SITE_OTHER): Payer: PPO

## 2018-02-21 DIAGNOSIS — I442 Atrioventricular block, complete: Secondary | ICD-10-CM | POA: Diagnosis not present

## 2018-02-21 DIAGNOSIS — I420 Dilated cardiomyopathy: Secondary | ICD-10-CM

## 2018-02-22 ENCOUNTER — Telehealth: Payer: Self-pay | Admitting: Emergency Medicine

## 2018-02-22 DIAGNOSIS — H25811 Combined forms of age-related cataract, right eye: Secondary | ICD-10-CM | POA: Diagnosis not present

## 2018-02-22 DIAGNOSIS — Z01818 Encounter for other preprocedural examination: Secondary | ICD-10-CM | POA: Diagnosis not present

## 2018-02-22 DIAGNOSIS — H353112 Nonexudative age-related macular degeneration, right eye, intermediate dry stage: Secondary | ICD-10-CM | POA: Diagnosis not present

## 2018-02-22 NOTE — Telephone Encounter (Signed)
Eye center called and wanted to know the last time his device was checked. Read it off to them from 01/23/2018 that device was normal function. They verbally understand

## 2018-02-24 ENCOUNTER — Telehealth: Payer: Self-pay | Admitting: Cardiology

## 2018-02-24 LAB — CUP PACEART REMOTE DEVICE CHECK
Battery Remaining Longevity: 49 mo
Battery Remaining Percentage: 40 %
Battery Voltage: 2.84 V
Brady Statistic AP VP Percent: 34 %
Brady Statistic AP VS Percent: 1 %
Brady Statistic AS VP Percent: 66 %
Brady Statistic AS VS Percent: 1 %
Brady Statistic RA Percent Paced: 34 %
Brady Statistic RV Percent Paced: 99 %
Date Time Interrogation Session: 20200203105058
Implantable Lead Implant Date: 20020201
Implantable Lead Location: 753859
Implantable Lead Location: 753860
Implantable Pulse Generator Implant Date: 20121127
Lead Channel Impedance Value: 210 Ohm
Lead Channel Impedance Value: 800 Ohm
Lead Channel Pacing Threshold Amplitude: 0.5 V
Lead Channel Pacing Threshold Amplitude: 1 V
Lead Channel Pacing Threshold Pulse Width: 0.4 ms
Lead Channel Sensing Intrinsic Amplitude: 12 mV
Lead Channel Sensing Intrinsic Amplitude: 3.4 mV
Lead Channel Setting Pacing Amplitude: 2 V
Lead Channel Setting Pacing Pulse Width: 0.4 ms
Lead Channel Setting Sensing Sensitivity: 5 mV
MDC IDC LEAD IMPLANT DT: 20020201
MDC IDC MSMT LEADCHNL RV PACING THRESHOLD PULSEWIDTH: 0.4 ms
MDC IDC SET LEADCHNL RV PACING AMPLITUDE: 0.75 V
Pulse Gen Model: 2210
Pulse Gen Serial Number: 7288642

## 2018-02-24 NOTE — Telephone Encounter (Signed)
About a pre op he has coming up

## 2018-02-25 NOTE — Telephone Encounter (Signed)
Called Angie with Dr. Lauralee Evener office she wants to know if Dr. Agustin Cree can clear patient for cataract surgery. Will consult with him.

## 2018-02-28 NOTE — Telephone Encounter (Signed)
Informed Robin at Dr. Lauralee Evener office that per Dr. Sheran Lawless the patient doesn't need to be seen by him for cataract surgery.

## 2018-03-01 DIAGNOSIS — H52221 Regular astigmatism, right eye: Secondary | ICD-10-CM | POA: Diagnosis not present

## 2018-03-01 DIAGNOSIS — H25811 Combined forms of age-related cataract, right eye: Secondary | ICD-10-CM | POA: Diagnosis not present

## 2018-03-01 DIAGNOSIS — E559 Vitamin D deficiency, unspecified: Secondary | ICD-10-CM | POA: Diagnosis not present

## 2018-03-01 DIAGNOSIS — I10 Essential (primary) hypertension: Secondary | ICD-10-CM | POA: Diagnosis not present

## 2018-03-01 DIAGNOSIS — H04123 Dry eye syndrome of bilateral lacrimal glands: Secondary | ICD-10-CM | POA: Diagnosis not present

## 2018-03-01 DIAGNOSIS — H259 Unspecified age-related cataract: Secondary | ICD-10-CM | POA: Diagnosis not present

## 2018-03-01 DIAGNOSIS — Z950 Presence of cardiac pacemaker: Secondary | ICD-10-CM | POA: Diagnosis not present

## 2018-03-01 DIAGNOSIS — H353112 Nonexudative age-related macular degeneration, right eye, intermediate dry stage: Secondary | ICD-10-CM | POA: Diagnosis not present

## 2018-03-01 DIAGNOSIS — Z79899 Other long term (current) drug therapy: Secondary | ICD-10-CM | POA: Diagnosis not present

## 2018-03-01 DIAGNOSIS — E7800 Pure hypercholesterolemia, unspecified: Secondary | ICD-10-CM | POA: Diagnosis not present

## 2018-03-02 NOTE — Progress Notes (Signed)
Remote pacemaker transmission.   

## 2018-03-04 ENCOUNTER — Other Ambulatory Visit: Payer: Self-pay | Admitting: Cardiology

## 2018-04-05 DIAGNOSIS — I10 Essential (primary) hypertension: Secondary | ICD-10-CM | POA: Diagnosis not present

## 2018-04-05 DIAGNOSIS — E559 Vitamin D deficiency, unspecified: Secondary | ICD-10-CM | POA: Diagnosis not present

## 2018-04-05 DIAGNOSIS — E663 Overweight: Secondary | ICD-10-CM | POA: Diagnosis not present

## 2018-04-05 DIAGNOSIS — H269 Unspecified cataract: Secondary | ICD-10-CM | POA: Diagnosis not present

## 2018-04-05 DIAGNOSIS — Z6827 Body mass index (BMI) 27.0-27.9, adult: Secondary | ICD-10-CM | POA: Diagnosis not present

## 2018-04-05 DIAGNOSIS — D696 Thrombocytopenia, unspecified: Secondary | ICD-10-CM | POA: Diagnosis not present

## 2018-04-05 DIAGNOSIS — E785 Hyperlipidemia, unspecified: Secondary | ICD-10-CM | POA: Diagnosis not present

## 2018-04-07 DIAGNOSIS — H25812 Combined forms of age-related cataract, left eye: Secondary | ICD-10-CM | POA: Diagnosis not present

## 2018-04-07 DIAGNOSIS — H2512 Age-related nuclear cataract, left eye: Secondary | ICD-10-CM | POA: Diagnosis not present

## 2018-05-06 ENCOUNTER — Telehealth: Payer: Self-pay | Admitting: *Deleted

## 2018-05-06 NOTE — Telephone Encounter (Signed)
YOUR CARDIOLOGY TEAM HAS ARRANGED FOR AN E-VISIT FOR YOUR APPOINTMENT - PLEASE REVIEW IMPORTANT INFORMATION BELOW SEVERAL DAYS PRIOR TO YOUR APPOINTMENT  Due to the recent COVID-19 pandemic, we are transitioning in-person office visits to tele-medicine visits in an effort to decrease unnecessary exposure to our patients, their families, and staff. These visits are billed to your insurance just like a normal visit is. We also encourage you to sign up for MyChart if you have not already done so. You will need a smartphone if possible. For patients that do not have this, we can still complete the visit using a regular telephone but do prefer a smartphone to enable video when possible. You may have a family member that lives with you that can help. If possible, we also ask that you have a blood pressure cuff and scale at home to measure your blood pressure, heart rate and weight prior to your scheduled appointment. Patients with clinical needs that need an in-person evaluation and testing will still be able to come to the office if absolutely necessary. If you have any questions, feel free to call our office.     YOUR PROVIDER WILL BE USING THE FOLLOWING PLATFORM TO COMPLETE YOUR VISIT: Staff: Please delete this text and fill in WebEx/MyChart/Doximity/Doxy.Me   IF USING WEBEX - How to Download the WebEx App to Your SmartPhone  - If Apple device, go to [employer] and type in WebEx in the search bar. [place] [employer], the blue/green circle. If Android, go to [employer] and type in [employer] in the search bar. The app is free but as with any other app download, your phone may require you to verify saved payment information or Apple/Android password.  - You do NOT have to create an account. - On the day of the visit, our staff will walk you through joining the meeting with the meeting number/password.   IF USING MYCHART - How to Download the MyChart App to Your SmartPhone   - If Apple,  go to [employer] and type in MyChart in the search bar and download the app. If Android, ask patient to go to [employer] and type in [place] in the search bar and download the app. The app is free but as with any other app downloads, your phone may require you to verify saved payment information or Apple/Android password.  - You will need to then log into the app with your MyChart username and password, and select Outlook as your healthcare provider to link the account. When it is time for your visit, go to the MyChart app, find appointments, and click Begin Video Visit. Be sure to Select Allow for your device to access the Microphone and Camera for your visit. You will then be connected, and your provider will be with you shortly.  **If you have any issues connecting or need assistance, please contact MyChart service desk [phone])**  **If using a computer, in order to ensure the best quality for your visit, you will need to use either of the following Internet Browsers: [employer], or Google Chrome**   IF USING DOXIMITY or DOXY.ME - The staff will give you instructions on receiving your link to join the meeting the day of your visit.      2-3 DAYS BEFORE YOUR APPOINTMENT  You will receive a telephone call from one of our [facility] team members - your caller ID may say "Unknown caller." If this is a video visit,  we will walk you through how to set up your device to be able to complete the visit. We will remind you check your blood pressure, heart rate and weight prior to your scheduled appointment. If you have an Apple Watch or Kardia, please upload any pertinent ECG strips the day before or morning of your appointment to [facility]. Our staff will also make sure you have reviewed the consent and agree to move forward with your scheduled tele-health visit.     THE DAY OF YOUR APPOINTMENT  Approximately 15 minutes prior to your scheduled appointment, you will receive  a telephone call from one of [facility] team - your caller ID may say "Unknown caller."  Our staff will confirm medications, vital signs for the day and any symptoms you may be experiencing. Please have this information available prior to the time of visit start. It may also be helpful for you to have a pad of paper and pen handy for any instructions given during your visit. They will also walk you through joining the smartphone meeting if this is a video visit.    CONSENT FOR TELE-HEALTH VISIT - PLEASE REVIEW  I hereby voluntarily request, consent and authorize [facility] and its employed or contracted physicians, physician assistants, nurse practitioners or other licensed health care professionals (the Practitioner), to provide me with telemedicine health care services (the Services") as deemed necessary by the treating Practitioner. I acknowledge and consent to receive the Services by the Practitioner via telemedicine. I understand that the telemedicine visit will involve communicating with the Practitioner through live audiovisual communication technology and the disclosure of certain medical information by electronic transmission. I acknowledge that I have been given the opportunity to request an in-person assessment or other available alternative prior to the telemedicine visit and am voluntarily participating in the telemedicine visit.  I understand that I have the right to withhold or withdraw my consent to the use of telemedicine in the course of my care at any time, without affecting my right to future care or treatment, and that the Practitioner or I may terminate the telemedicine visit at any time. I understand that I have the right to inspect all information obtained and/or recorded in the course of the telemedicine visit and may receive copies of available information for a reasonable fee.  I understand that some of the potential risks of receiving the Services via telemedicine include:    Delay or interruption in medical evaluation due to technological equipment failure or disruption;  Information transmitted may not be sufficient (e.g. poor resolution of images) to allow for appropriate medical decision making by the Practitioner; and/or   In rare instances, security protocols could fail, causing a breach of personal health information.  Furthermore, I acknowledge that it is my responsibility to provide information about my medical history, conditions and care that is complete and accurate to the best of my ability. I acknowledge that Practitioner's advice, recommendations, and/or decision may be based on factors not within their control, such as incomplete or inaccurate data provided by me or distortions of diagnostic images or specimens that may result from electronic transmissions. I understand that the practice of medicine is not an exact science and that Practitioner makes no warranties or guarantees regarding treatment outcomes. I acknowledge that I will receive a copy of this consent concurrently upon execution via email to the email address I last provided but may also request a printed copy by calling the office of [facility].    I  understand that my insurance will be billed for this visit.   I have read or had this consent read to me.  I understand the contents of this consent, which adequately explains the benefits and risks of the Services being provided via telemedicine.   I have been provided ample opportunity to ask questions regarding this consent and the Services and have had my questions answered to my satisfaction.  I give my informed consent for the services to be provided through the use of telemedicine in my medical care  By participating in this telemedicine visit I agree to the above. Pt consented.  Cardiac Questionnaire:    Since your last visit or hospitalization:    1. Have you been having new or worsening chest pain? no   2. Have you been  having new or worsening shortness of breath? no 3. Have you been having new or worsening leg swelling, wt gain, or increase in abdominal girth (pants fitting more tightly)? no   4. Have you had any passing out spells? no    *A YES to any of these questions would result in the appointment being kept. *If all the answers to these questions are NO, we should indicate that given the current situation regarding the worldwide coronarvirus pandemic, at the recommendation of the CDC, we are looking to limit gatherings in our waiting area, and thus will reschedule their appointment beyond four weeks from today.   _____________   [id number] Pre-Screening Questions:   Do you currently have a fever? no (yes = cancel and refer to pcp for e-visit)  Have you recently travelled on a cruise, internationally, or to [place], [place], [place], [place], [place], or [place], [place]) ? no (yes = cancel, stay home, monitor symptoms, and contact pcp or initiate e-visit if symptoms develop)  Have you been in contact with someone that is currently pending confirmation of Covid19 testing or has been confirmed to have the [place] virus?  no (yes = cancel, stay home, away from tested individual, monitor symptoms, and contact pcp or initiate e-visit if symptoms develop)  Are you currently experiencing fatigue or cough? no (yes = pt should be prepared to have a mask placed at the time of their visit).

## 2018-05-09 ENCOUNTER — Telehealth: Payer: Self-pay | Admitting: Cardiology

## 2018-05-09 NOTE — Telephone Encounter (Signed)
F/U Message             Patient returned the call and would like a call back.

## 2018-05-10 NOTE — Telephone Encounter (Signed)
Returned pt's call and spoke with wife. She wasn't sure why he had called so she said would have him call us back.

## 2018-05-18 ENCOUNTER — Other Ambulatory Visit: Payer: Self-pay | Admitting: Cardiology

## 2018-05-19 ENCOUNTER — Telehealth (INDEPENDENT_AMBULATORY_CARE_PROVIDER_SITE_OTHER): Payer: PPO | Admitting: Cardiology

## 2018-05-19 ENCOUNTER — Encounter: Payer: Self-pay | Admitting: Cardiology

## 2018-05-19 ENCOUNTER — Other Ambulatory Visit: Payer: Self-pay

## 2018-05-19 VITALS — BP 129/73 | HR 60 | Wt 180.0 lb

## 2018-05-19 DIAGNOSIS — I442 Atrioventricular block, complete: Secondary | ICD-10-CM

## 2018-05-19 DIAGNOSIS — I420 Dilated cardiomyopathy: Secondary | ICD-10-CM

## 2018-05-19 DIAGNOSIS — Z950 Presence of cardiac pacemaker: Secondary | ICD-10-CM

## 2018-05-19 DIAGNOSIS — E785 Hyperlipidemia, unspecified: Secondary | ICD-10-CM

## 2018-05-19 DIAGNOSIS — I10 Essential (primary) hypertension: Secondary | ICD-10-CM

## 2018-05-19 HISTORY — DX: Presence of cardiac pacemaker: Z95.0

## 2018-05-19 NOTE — Progress Notes (Signed)
Error - see note from Dr. Agustin Cree 05/19/18.

## 2018-05-19 NOTE — Progress Notes (Signed)
Virtual Visit via Telephone Note   This visit type was conducted due to national recommendations for restrictions regarding the COVID-19 Pandemic (e.g. social distancing) in an effort to limit this patient's exposure and mitigate transmission in our community.  Due to his co-morbid illnesses, this patient is at least at moderate risk for complications without adequate follow up.  This format is felt to be most appropriate for this patient at this time.  The patient did not have access to video technology/had technical difficulties with video requiring transitioning to audio format only (telephone).  All issues noted in this document were discussed and addressed.  No physical exam could be performed with this format.  Please refer to the patient's chart for his  consent to telehealth for [facility].  Evaluation Performed:  Follow-up visit  This visit type was conducted due to national recommendations for restrictions regarding the COVID-19 Pandemic (e.g. social distancing).  This format is felt to be most appropriate for this patient at this time.  All issues noted in this document were discussed and addressed.  No physical exam was performed (except for noted visual exam findings with Video Visits).  Please refer to the patient's chart (MyChart message for video visits and phone note for telephone visits) for the patient's consent to telehealth for [facility].  Date:  05/19/2018   ID: Alvin Lang, DOB [birth year], MRN [id number]   Patient Location: [place] [address]   Provider location:   [place] Office  PCP:  Nicholos Johns, MD  Cardiologist:  Jenne Campus, MD     Chief Complaint: Like always Alvin Lang is doing very well  History of Present Illness:    Alvin Lang is a 73 y.o. male  who presents via audio/video conferencing for a telehealth visit today.  With past medical history significant for complete heart block.  Dual-chamber  pacemaker implanted few years ago.  Likely will notice deterioration of his left ventricular ejection fraction down to 30%.  He actually saw somebody else with recommendation to proceed with upgrading to ICD with bi V pacing.  He was reluctant to proceed that direction and he came to me for second opinion.  We decided to augment his medical therapy now he is on a very decent dose of carvedilol as well as Entresto and Aldactone.  His ejection fraction improved to 40 to 45% based on last echocardiogram done in December shortness is doing great, he is asymptomatic there is no chest pain, tightness, pressure, burning in the chest.  He is compliant with restriction because of coronavirus but overall doing very well.  We discussed the issue of his diminished left ventricular ejection fraction I brought up the issue of potentially upgrading his device to by V pacing.  He does not meet criteria for ICD right now.  He is reluctant to be which conclusion that we will check his Chem-7 and based on Chem-7 we will decide if we need to augment his Entresto.  I will schedule him to have an echocardiogram which we will do in June and look at ejection fraction if ejection fraction deteriorated then we need to upgrade his device to most likely by V pacing.  He agreed with this plan.   The patient does not have symptoms concerning for COVID-19 infection (fever, chills, cough, or new SHORTNESS OF BREATH).    Prior CV studies:   The following studies were reviewed today:  Echocardiogram from December reviewed which  showed ejection fraction 4045%     Past Medical History:  Diagnosis Date   Chronic systolic dysfunction of left ventricle    Complete heart block ([place])    Hyperlipidemia    Hypertension    Nonischemic cardiomyopathy [facility])     Past Surgical History:  Procedure Laterality Date   PACEMAKER GENERATOR CHANGE Left 12/16/2010   [facility] DR RF PPM for complete heart block,  generator change by Dr  Agustin Cree   PACEMAKER IMPLANT  2002   by Dr Agustin Cree     Current Meds  Medication Sig   atorvastatin (LIPITOR) 10 MG tablet Take 10 mg by mouth daily.   Calcium Carb-Cholecalciferol (CALCIUM-VITAMIN D) 500-200 MG-UNIT tablet Take 1 tablet by mouth 2 (two) times daily.   carvedilol (COREG) 25 MG tablet Take 1 tablet by mouth twice daily   DOCOSAHEXAENOIC ACID PO Take 2 g by mouth daily.   ENTRESTO 49-51 MG TAKE 1 TABLET BY MOUTH TWICE DAILY   spironolactone (ALDACTONE) 25 MG tablet TAKE 1/2 (ONE-HALF) TABLET BY MOUTH ONCE DAILY      Family History: The patient's family history includes Diabetes in his father; Heart attack in his mother; Heart disease in his father.   ROS:   Please see the history of present illness.     All other systems reviewed and are negative.   Labs/Other Tests and Data Reviewed:     Recent Labs: No results found for requested labs within last 8760 hours.  Recent Lipid Panel No results found for: CHOL, TRIG, HDL, CHOLHDL, VLDL, LDLCALC, LDLDIRECT    Exam:    Vital Signs:  BP 129/73    Pulse 60    Wt 180 lb (81.6 kg)    BMI 25.10 kg/m     Wt Readings from Last 3 Encounters:  05/19/18 180 lb (81.6 kg)  11/01/17 188 lb 9.6 oz (85.5 kg)  04/23/17 191 lb (86.6 kg)     Well nourished, well developed in no acute distress. Alert awake currently x3.  We are unable to establish video link.  We tried multiple times but there was some black on his smart phone and he was not able to overcome that.  Diagnosis for this visit:   1. Complete heart block ([place])   2. Dilated cardiomyopathy ([place])   3. Essential hypertension   4. Dyslipidemia   5. Pacemaker      ASSESSMENT & PLAN:    1.  Complete heart block.  Her pacer present is a simple dual-chamber device for years left in the device he is being paced ventricularly more than 90% of time therefore in view of his deterioration of left ventricular ejection fraction by V pacing is reasonable.   Plan is as outlined above we will repeat his echocardiogram to check and see if there is any improvement with appropriate medical therapy.  Also Chem-7 will be checked to see if it augment his medical therapy. 2.  Dilated cardiomyopathy plan as outlined above overall hemodynamically stable and clinically doing very well. 3.  Essential hypertension.  His blood pressure seems to be controlled. 4.  Dyslipidemia on atorvastatin 10 which I will continue however I asked him to have his fasting lipid profile done. 5.  Pacemaker present interrogation reviewed which was done in February for years left in the device normal functioning device  COVID-19 Education: The signs and symptoms of COVID-19 were discussed with the patient and how to seek care for testing (follow up with PCP or arrange E-visit).  The importance of social distancing was discussed today.  Patient Risk:   After full review of this patients clinical status, I feel that they are at least moderate risk at this time.  Time:   Today, I have spent 18 minutes with the patient with telehealth technology discussing pt health issues.  I spent 5 minutes reviewing her chart before the visit.  Visit was finished at 8:36 AM.    Medication Adjustments/Labs and Tests Ordered: Current medicines are reviewed at length with the patient today.  Concerns regarding medicines are outlined above.  No orders of the defined types were placed in this encounter.  Medication changes: No orders of the defined types were placed in this encounter.    Disposition: Follow-up with me in 5 months.  Echocardiogram will be scheduled for June.  Chem-7 as well as fasting lipid profile will be done  Signed, Park Liter, MD, [facility] 05/19/2018 8:38 AM    [place]

## 2018-05-19 NOTE — Patient Instructions (Signed)
Medication Instructions:  Your physician recommends that you continue on your current medications as directed. Please refer to the Current Medication list given to you today.  If you need a refill on your cardiac medications before your next appointment, please call your pharmacy.   Lab work: Your physician recommends that you return for lab work in: Tomorrow or nest week Fasting Lipid, BMP  If you have labs (blood work) drawn today and your tests are completely normal, you will receive your results only by: Mar[facility] Kitchen MyChart Message (if you have MyChart) OR . A paper copy in the mail If you have any lab test that is abnormal or we need to change your treatment, we will call you to review the results.  Testing/Procedures: Your physician has requested that you have an echocardiogram. Echocardiography is a painless test that uses sound waves to create images of your heart. It provides your doctor with information about the size and shape of your heart and how well your heart's chambers and valves are working. This procedure takes approximately one hour. There are no restrictions for this procedure.    Follow-Up: At [facility], you and your health needs are our priority.  As part of our continuing mission to provide you with exceptional heart care, we have created designated Provider Care Teams.  These Care Teams include your primary Cardiologist (physician) and Advanced Practice Providers (APPs -  Physician Assistants and Nurse Practitioners) who all work together to provide you with the care you need, when you need it. You will need a follow up appointment in 5 months. Any Other Special Instructions Will Be Listed Below (If Applicable).   Echocardiogram An echocardiogram is a procedure that uses painless sound waves (ultrasound) to produce an image of the heart. Images from an echocardiogram can provide important information about:  Signs of coronary artery disease (CAD).  Aneurysm  detection. An aneurysm is a weak or damaged part of an artery wall that bulges out from the normal force of blood pumping through the body.  Heart size and shape. Changes in the size or shape of the heart can be associated with certain conditions, including heart failure, aneurysm, and CAD.  Heart muscle function.  Heart valve function.  Signs of a past heart attack.  Fluid buildup around the heart.  Thickening of the heart muscle.  A tumor or infectious growth around the heart valves. Tell a health care provider about:  Any allergies you have.  All medicines you are taking, including vitamins, herbs, eye drops, creams, and over-the-counter medicines.  Any blood disorders you have.  Any surgeries you have had.  Any medical conditions you have.  Whether you are pregnant or may be pregnant. What are the risks? Generally, this is a safe procedure. However, problems may occur, including:  Allergic reaction to dye (contrast) that may be used during the procedure. What happens before the procedure? No specific preparation is needed. You may eat and drink normally. What happens during the procedure?   An IV tube may be inserted into one of your veins.  You may receive contrast through this tube. A contrast is an injection that improves the quality of the pictures from your heart.  A gel will be applied to your chest.  A wand-like tool (transducer) will be moved over your chest. The gel will help to transmit the sound waves from the transducer.  The sound waves will harmlessly bounce off of your heart to allow the heart images to be captured  in real-time motion. The images will be recorded on a computer. The procedure may vary among health care providers and hospitals. What happens after the procedure?  You may return to your normal, everyday life, including diet, activities, and medicines, unless your health care provider tells you not to do that. Summary  An  echocardiogram is a procedure that uses painless sound waves (ultrasound) to produce an image of the heart.  Images from an echocardiogram can provide important information about the size and shape of your heart, heart muscle function, heart valve function, and fluid buildup around your heart.  You do not need to do anything to prepare before this procedure. You may eat and drink normally.  After the echocardiogram is completed, you may return to your normal, everyday life, unless your health care provider tells you not to do that. This information is not intended to replace advice given to you by your health care provider. Make sure you discuss any questions you have with your health care provider. Document Released: 01/03/2000 Document Revised: 02/08/2016 Document Reviewed: 02/08/2016 Elsevier Interactive Patient Education  2019 [employer].

## 2018-05-23 DIAGNOSIS — E785 Hyperlipidemia, unspecified: Secondary | ICD-10-CM | POA: Diagnosis not present

## 2018-05-23 DIAGNOSIS — I10 Essential (primary) hypertension: Secondary | ICD-10-CM | POA: Diagnosis not present

## 2018-05-23 DIAGNOSIS — I420 Dilated cardiomyopathy: Secondary | ICD-10-CM | POA: Diagnosis not present

## 2018-05-23 LAB — BASIC METABOLIC PANEL
BUN/Creatinine Ratio: 19 (ref 10–24)
BUN: 15 mg/dL (ref 8–27)
CO2: 22 mmol/L (ref 20–29)
Calcium: 8.8 mg/dL (ref 8.6–10.2)
Chloride: 103 mmol/L (ref 96–106)
Creatinine, Ser: 0.8 mg/dL (ref 0.76–1.27)
GFR calc Af Amer: 102 mL/min/{1.73_m2} (ref >59)
GFR calc non Af Amer: 88 mL/min/{1.73_m2} (ref >59)
Glucose: 103 mg/dL — ABNORMAL HIGH (ref 65–99)
Potassium: 4.2 mmol/L (ref 3.5–5.2)
Sodium: 137 mmol/L (ref 134–144)

## 2018-05-23 LAB — LIPID PANEL
Chol/HDL Ratio: 5.8 ratio — ABNORMAL HIGH (ref 0.0–5.0)
Cholesterol, Total: 162 mg/dL (ref 100–199)
HDL: 28 mg/dL — ABNORMAL LOW (ref >39)
LDL Calculated: 95 mg/dL (ref 0–99)
Triglycerides: 195 mg/dL — ABNORMAL HIGH (ref 0–149)
VLDL Cholesterol Cal: 39 mg/dL (ref 5–40)

## 2018-05-26 ENCOUNTER — Telehealth: Payer: Self-pay | Admitting: Emergency Medicine

## 2018-05-26 NOTE — Telephone Encounter (Signed)
Left message for patient to return call regarding lab results.  

## 2018-05-27 MED ORDER — ATORVASTATIN CALCIUM 10 MG PO TABS: 20.0000 mg | ORAL_TABLET | Freq: Every day | ORAL | 1 refills | Status: DC

## 2018-05-27 NOTE — Telephone Encounter (Signed)
Patient called back and was informed of lab results and advised to increase lipitor to 20 mg daily per Dr. Agustin Cree. Patient also made aware to have fating labs drawn in 6 weeks. Patient verbally understands. No further questions.

## 2018-05-27 NOTE — Addendum Note (Signed)
Addended by: Ashok Norris on: 05/27/2018 08:57 AM   Modules accepted: Orders

## 2018-05-30 ENCOUNTER — Other Ambulatory Visit: Payer: Self-pay

## 2018-05-30 ENCOUNTER — Ambulatory Visit (INDEPENDENT_AMBULATORY_CARE_PROVIDER_SITE_OTHER): Payer: PPO | Admitting: *Deleted

## 2018-05-30 DIAGNOSIS — I420 Dilated cardiomyopathy: Secondary | ICD-10-CM

## 2018-05-30 DIAGNOSIS — I442 Atrioventricular block, complete: Secondary | ICD-10-CM

## 2018-05-31 LAB — CUP PACEART REMOTE DEVICE CHECK
Date Time Interrogation Session: 20200512100530
Implantable Lead Implant Date: 20020201
Implantable Lead Implant Date: 20020201
Implantable Lead Location: 753859
Implantable Lead Location: 753860
Implantable Pulse Generator Implant Date: 20121127
Pulse Gen Model: 2210
Pulse Gen Serial Number: 7288642

## 2018-06-06 ENCOUNTER — Other Ambulatory Visit: Payer: Self-pay | Admitting: Cardiology

## 2018-06-14 NOTE — Progress Notes (Signed)
Remote pacemaker transmission.   

## 2018-06-22 DIAGNOSIS — Z1339 Encounter for screening examination for other mental health and behavioral disorders: Secondary | ICD-10-CM | POA: Diagnosis not present

## 2018-06-22 DIAGNOSIS — Z125 Encounter for screening for malignant neoplasm of prostate: Secondary | ICD-10-CM | POA: Diagnosis not present

## 2018-06-22 DIAGNOSIS — Z0000 Encounter for general adult medical examination without abnormal findings: Secondary | ICD-10-CM | POA: Diagnosis not present

## 2018-06-22 DIAGNOSIS — E785 Hyperlipidemia, unspecified: Secondary | ICD-10-CM | POA: Diagnosis not present

## 2018-06-22 DIAGNOSIS — Z9181 History of falling: Secondary | ICD-10-CM | POA: Diagnosis not present

## 2018-06-22 DIAGNOSIS — Z1331 Encounter for screening for depression: Secondary | ICD-10-CM | POA: Diagnosis not present

## 2018-07-13 DIAGNOSIS — H524 Presbyopia: Secondary | ICD-10-CM | POA: Diagnosis not present

## 2018-07-26 DIAGNOSIS — M1990 Unspecified osteoarthritis, unspecified site: Secondary | ICD-10-CM | POA: Diagnosis not present

## 2018-07-26 DIAGNOSIS — E559 Vitamin D deficiency, unspecified: Secondary | ICD-10-CM | POA: Diagnosis not present

## 2018-07-26 DIAGNOSIS — E785 Hyperlipidemia, unspecified: Secondary | ICD-10-CM | POA: Diagnosis not present

## 2018-07-26 DIAGNOSIS — Z79899 Other long term (current) drug therapy: Secondary | ICD-10-CM | POA: Diagnosis not present

## 2018-07-26 DIAGNOSIS — E663 Overweight: Secondary | ICD-10-CM | POA: Diagnosis not present

## 2018-07-26 DIAGNOSIS — Z9849 Cataract extraction status, unspecified eye: Secondary | ICD-10-CM | POA: Diagnosis not present

## 2018-07-26 DIAGNOSIS — M8580 Other specified disorders of bone density and structure, unspecified site: Secondary | ICD-10-CM | POA: Diagnosis not present

## 2018-07-26 DIAGNOSIS — I10 Essential (primary) hypertension: Secondary | ICD-10-CM | POA: Diagnosis not present

## 2018-07-26 DIAGNOSIS — D696 Thrombocytopenia, unspecified: Secondary | ICD-10-CM | POA: Diagnosis not present

## 2018-07-26 DIAGNOSIS — Z6826 Body mass index (BMI) 26.0-26.9, adult: Secondary | ICD-10-CM | POA: Diagnosis not present

## 2018-08-21 ENCOUNTER — Other Ambulatory Visit: Payer: Self-pay | Admitting: Cardiology

## 2018-08-29 ENCOUNTER — Ambulatory Visit (INDEPENDENT_AMBULATORY_CARE_PROVIDER_SITE_OTHER): Payer: PPO | Admitting: *Deleted

## 2018-08-29 DIAGNOSIS — I442 Atrioventricular block, complete: Secondary | ICD-10-CM | POA: Diagnosis not present

## 2018-08-30 LAB — CUP PACEART REMOTE DEVICE CHECK
Battery Remaining Longevity: 32 mo
Battery Remaining Percentage: 25 %
Battery Voltage: 2.8 V
Brady Statistic AP VP Percent: 38 %
Brady Statistic AP VS Percent: 1 %
Brady Statistic AS VP Percent: 61 %
Brady Statistic AS VS Percent: 1 %
Brady Statistic RA Percent Paced: 38 %
Brady Statistic RV Percent Paced: 99 %
Date Time Interrogation Session: 20200811060121
Implantable Lead Implant Date: 20020201
Implantable Lead Implant Date: 20020201
Implantable Lead Location: 753859
Implantable Lead Location: 753860
Implantable Pulse Generator Implant Date: 20121127
Lead Channel Impedance Value: 240 Ohm
Lead Channel Impedance Value: 900 Ohm
Lead Channel Pacing Threshold Amplitude: 0.625 V
Lead Channel Pacing Threshold Amplitude: 1 V
Lead Channel Pacing Threshold Pulse Width: 0.4 ms
Lead Channel Pacing Threshold Pulse Width: 0.4 ms
Lead Channel Sensing Intrinsic Amplitude: 12 mV
Lead Channel Sensing Intrinsic Amplitude: 3.3 mV
Lead Channel Setting Pacing Amplitude: 0.875
Lead Channel Setting Pacing Amplitude: 2 V
Lead Channel Setting Pacing Pulse Width: 0.4 ms
Lead Channel Setting Sensing Sensitivity: 5 mV
Pulse Gen Model: 2210
Pulse Gen Serial Number: 7288642

## 2018-09-06 ENCOUNTER — Encounter: Payer: Self-pay | Admitting: Cardiology

## 2018-09-06 NOTE — Progress Notes (Signed)
Remote pacemaker transmission.   

## 2018-10-19 ENCOUNTER — Telehealth: Payer: PPO | Admitting: Cardiology

## 2018-10-26 DIAGNOSIS — Z23 Encounter for immunization: Secondary | ICD-10-CM | POA: Diagnosis not present

## 2018-10-26 DIAGNOSIS — J309 Allergic rhinitis, unspecified: Secondary | ICD-10-CM | POA: Diagnosis not present

## 2018-10-26 DIAGNOSIS — E559 Vitamin D deficiency, unspecified: Secondary | ICD-10-CM | POA: Diagnosis not present

## 2018-10-26 DIAGNOSIS — M1990 Unspecified osteoarthritis, unspecified site: Secondary | ICD-10-CM | POA: Diagnosis not present

## 2018-10-26 DIAGNOSIS — Z79899 Other long term (current) drug therapy: Secondary | ICD-10-CM | POA: Diagnosis not present

## 2018-10-26 DIAGNOSIS — Z139 Encounter for screening, unspecified: Secondary | ICD-10-CM | POA: Diagnosis not present

## 2018-10-26 DIAGNOSIS — Z6827 Body mass index (BMI) 27.0-27.9, adult: Secondary | ICD-10-CM | POA: Diagnosis not present

## 2018-10-26 DIAGNOSIS — Z9181 History of falling: Secondary | ICD-10-CM | POA: Diagnosis not present

## 2018-10-26 DIAGNOSIS — E663 Overweight: Secondary | ICD-10-CM | POA: Diagnosis not present

## 2018-10-26 DIAGNOSIS — E785 Hyperlipidemia, unspecified: Secondary | ICD-10-CM | POA: Diagnosis not present

## 2018-10-26 DIAGNOSIS — I10 Essential (primary) hypertension: Secondary | ICD-10-CM | POA: Diagnosis not present

## 2018-10-27 ENCOUNTER — Telehealth: Payer: Self-pay

## 2018-10-27 NOTE — Telephone Encounter (Signed)
Left a message regarding appt on 10/28/18. 

## 2018-10-28 ENCOUNTER — Other Ambulatory Visit: Payer: Self-pay

## 2018-10-28 ENCOUNTER — Encounter: Payer: Self-pay | Admitting: Internal Medicine

## 2018-10-28 ENCOUNTER — Telehealth (INDEPENDENT_AMBULATORY_CARE_PROVIDER_SITE_OTHER): Payer: PPO | Admitting: Internal Medicine

## 2018-10-28 VITALS — BP 126/71 | HR 52 | Ht 71.0 in | Wt 181.0 lb

## 2018-10-28 DIAGNOSIS — I442 Atrioventricular block, complete: Secondary | ICD-10-CM

## 2018-10-28 DIAGNOSIS — I420 Dilated cardiomyopathy: Secondary | ICD-10-CM | POA: Diagnosis not present

## 2018-10-28 NOTE — Progress Notes (Signed)
Electrophysiology TeleHealth Note  Due to national recommendations of social distancing due to [place] 19, an audio telehealth visit is felt to be most appropriate for this patient at this time.  Verbal consent was obtained by me for the telehealth visit today.  The patient does not have capability for a virtual visit.  A phone visit is therefore required today.   Date:  10/28/2018   ID:  Alvin Lang, DOB [birth year], MRN [id number]  Location: patient's home  Provider location:  [facility]  Evaluation Performed: Follow-up visit  PCP:  Nicholos Johns, MD   Electrophysiologist:  Dr Rayann Heman  Chief Complaint:  Pacemaker follow up  History of Present Illness:    Alvin Lang is a 73 y.o. male who presents via telehealth conferencing today.  Since last being seen in our clinic, the patient reports doing very well.  Today, he denies symptoms of palpitations, chest pain, shortness of breath,  lower extremity edema, dizziness, presyncope, or syncope.  The patient is otherwise without complaint today.  The patient denies symptoms of fevers, chills, cough, or new SOB worrisome for COVID 19.  Past Medical History:  Diagnosis Date  . Chronic systolic dysfunction of left ventricle   . Complete heart block ([place])   . Hyperlipidemia   . Hypertension   . Nonischemic cardiomyopathy [facility])     Past Surgical History:  Procedure Laterality Date  . PACEMAKER GENERATOR CHANGE Left 12/16/2010   SJM Accent DR RF PPM for complete heart block,  generator change by Dr Agustin Cree  . PACEMAKER IMPLANT  2002   by Dr Agustin Cree    Current Outpatient Medications  Medication Sig Dispense Refill  . atorvastatin (LIPITOR) 10 MG tablet Take 2 tablets (20 mg total) by mouth daily. 180 tablet 1  . Calcium Carb-Cholecalciferol (CALCIUM-VITAMIN D) 500-200 MG-UNIT tablet Take 1 tablet by mouth 2 (two) times daily.    . carvedilol (COREG) 25 MG tablet Take 1 tablet by mouth twice daily 180 tablet 0  .  DOCOSAHEXAENOIC ACID PO Take 2 g by mouth daily.    Marland Kitchen ENTRESTO 49-51 MG Take 1 tablet by mouth twice daily 180 tablet 1  . spironolactone (ALDACTONE) 25 MG tablet TAKE 1/2 (ONE-HALF) TABLET BY MOUTH ONCE DAILY 45 tablet 6  . Vitamin D, Ergocalciferol, (DRISDOL) 1.25 MG (50000 UT) CAPS capsule Take 1 capsule by mouth once a week.     No current facility-administered medications for this visit.     Allergies:   Patient has no known allergies.   Social History:  The patient  reports that he has quit smoking. He has never used smokeless tobacco. He reports that he does not drink alcohol or use drugs.   Family History:  The patient's  family history includes Diabetes in his father; Heart attack in his mother; Heart disease in his father.   ROS:  Please see the history of present illness.   All other systems are personally reviewed and negative.    Exam:    Vital Signs:  BP 126/71   Pulse (!) 52   Ht 5\' 11"  (1.803 m)   Wt 181 lb (82.1 kg)   BMI 25.24 kg/m   Well sounding and appearing, alert and conversant, regular work of breathing   Labs/Other Tests and Data Reviewed:    Recent Labs: 05/23/2018: BUN 15; Creatinine, Ser 0.80; Potassium 4.2; Sodium 137   Wt Readings from Last 3 Encounters:  10/28/18 181 lb (82.1 kg)  05/19/18 180 lb (  81.6 kg)  11/01/17 188 lb 9.6 oz (85.5 kg)     Last device remote is reviewed from [place] PDF which reveals normal device function, no arrhythmias    ASSESSMENT & PLAN:    1.  Complete heart block Normal device function per recent remote His EF has improved to 40-45% by echo in December Reviewed again possibility of CRTP upgrade, the patient declines at this time.  2. NICM/chronic systolic heart failure Euvolemic by symptoms Continue current therapy   Follow-up:  Merlin, pt prefers to follow up in 1 year with me    Patient Risk:  after full review of this patients clinical status, I feel that they are at moderate risk at this time.   Today, I have spent 15 minutes with the patient with telehealth technology discussing arrhythmia management .    Army Fossa, MD  10/28/2018 2:43 PM     [address] [place] [id number] [phone] (office) [phone] (fax)

## 2018-11-14 ENCOUNTER — Encounter: Payer: Self-pay | Admitting: Cardiology

## 2018-11-14 ENCOUNTER — Other Ambulatory Visit: Payer: Self-pay

## 2018-11-14 ENCOUNTER — Ambulatory Visit (INDEPENDENT_AMBULATORY_CARE_PROVIDER_SITE_OTHER): Payer: PPO | Admitting: *Deleted

## 2018-11-14 ENCOUNTER — Ambulatory Visit (INDEPENDENT_AMBULATORY_CARE_PROVIDER_SITE_OTHER): Payer: PPO | Admitting: Cardiology

## 2018-11-14 VITALS — BP 138/72 | HR 48 | Ht 71.0 in | Wt 182.0 lb

## 2018-11-14 DIAGNOSIS — Z950 Presence of cardiac pacemaker: Secondary | ICD-10-CM

## 2018-11-14 DIAGNOSIS — I442 Atrioventricular block, complete: Secondary | ICD-10-CM

## 2018-11-14 DIAGNOSIS — E785 Hyperlipidemia, unspecified: Secondary | ICD-10-CM | POA: Diagnosis not present

## 2018-11-14 DIAGNOSIS — I420 Dilated cardiomyopathy: Secondary | ICD-10-CM | POA: Diagnosis not present

## 2018-11-14 DIAGNOSIS — I10 Essential (primary) hypertension: Secondary | ICD-10-CM

## 2018-11-14 LAB — CUP PACEART INCLINIC DEVICE CHECK
Battery Remaining Longevity: 32 mo
Battery Voltage: 2.8 V
Brady Statistic RA Percent Paced: 39 %
Brady Statistic RV Percent Paced: 99.52 %
Date Time Interrogation Session: 20201026124353
Implantable Lead Implant Date: 20020201
Implantable Lead Implant Date: 20020201
Implantable Lead Location: 753859
Implantable Lead Location: 753860
Implantable Pulse Generator Implant Date: 20121127
Lead Channel Impedance Value: 212.5 Ohm
Lead Channel Impedance Value: 737.5 Ohm
Lead Channel Pacing Threshold Amplitude: 0.5 V
Lead Channel Pacing Threshold Amplitude: 1 V
Lead Channel Pacing Threshold Pulse Width: 0.4 ms
Lead Channel Pacing Threshold Pulse Width: 0.4 ms
Lead Channel Sensing Intrinsic Amplitude: 2.6 mV
Lead Channel Setting Pacing Amplitude: 0.75 V
Lead Channel Setting Pacing Amplitude: 2 V
Lead Channel Setting Pacing Pulse Width: 0.4 ms
Lead Channel Setting Sensing Sensitivity: 5 mV
Pulse Gen Model: 2210
Pulse Gen Serial Number: 7288642

## 2018-11-14 NOTE — Progress Notes (Signed)
Cardiology Office Note:    Date:  11/14/2018   ID:  [name], DOB 01-Aug-1945, MRN [id number]  PCP:  Nicholos Johns, MD  Cardiologist:  Jenne Campus, MD    Referring MD: Nicholos Johns, MD   Chief Complaint  Patient presents with  . Follow-up  Doing well  History of Present Illness:    Alvin Lang is a 73 y.o. male with history of nonischemic cardiomyopathy, dual-chamber pacemaker, hypertension, dyslipidemia.  Recently we noted that he does have significantly diminished left ventricular ejection fraction.  Appropriate guidelines guided medical therapy has been initiated which include Entresto as well as Coreg his ejection fraction improved.  He supposed to have an echocardiogram done in the summer but however he did not do it.  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  Few days ago he was putting some metallic shielding on the building and have no difficulty doing it interestingly EKG today showed he is ventricular response of 49 bpm.  No dizziness no syncope no chest pain.  Past Medical History:  Diagnosis Date  . Chronic systolic dysfunction of left ventricle   . Complete heart block ([place])   . Hyperlipidemia   . Hypertension   . Nonischemic cardiomyopathy [facility])     Past Surgical History:  Procedure Laterality Date  . PACEMAKER GENERATOR CHANGE Left 12/16/2010   SJM Accent DR RF PPM for complete heart block,  generator change by Dr Agustin Cree  . PACEMAKER IMPLANT  2002   by Dr Agustin Cree    Current Medications: Current Meds  Medication Sig  . atorvastatin (LIPITOR) 20 MG tablet Take 20 mg by mouth daily.  . Calcium Carb-Cholecalciferol (CALCIUM-VITAMIN D) 500-200 MG-UNIT tablet Take 1 tablet by mouth 2 (two) times daily.  . carvedilol (COREG) 25 MG tablet Take 1 tablet by mouth twice daily  .  DOCOSAHEXAENOIC ACID PO Take 2 g by mouth daily.  Marland Kitchen ENTRESTO 49-51 MG Take 1 tablet by mouth twice daily  . spironolactone (ALDACTONE) 25 MG tablet TAKE 1/2 (ONE-HALF) TABLET BY MOUTH ONCE DAILY  . Vitamin D, Ergocalciferol, (DRISDOL) 1.25 MG (50000 UT) CAPS capsule Take 1 capsule by mouth every 21 ( twenty-one) days.      Allergies:   Patient has no known allergies.   Social History   Socioeconomic History  . Marital status: Married    Spouse name: Not on file  . Number of children: Not on file  . Years of education: Not on file  . Highest education level: Not on file  Occupational History  . Not on file  Social Needs  . Financial resource strain: Not on file  . Food insecurity    Worry: Not on file    Inability: Not on file  . Transportation needs    Medical: Not on file    Non-medical: Not on file  Tobacco Use  . Smoking status: Former Research scientist (life sciences)  . Smokeless tobacco: Never Used  Substance and Sexual Activity  . Alcohol use: No  . Drug use: No  . Sexual activity: Not on file  Lifestyle  . Physical activity    Days per week: Not on file    Minutes per session: Not on file  . Stress: Not on file  Relationships  . Social connections    Talks  on phone: Not on file    Gets together: Not on file    Attends religious service: Not on file    Active member of club or organization: Not on file    Attends meetings of clubs or organizations: Not on file    Relationship status: Not on file  Other Topics Concern  . Not on file  Social History Narrative  . Not on file     Family History: The patient's family history includes Diabetes in his father; Heart attack in his mother; Heart disease in his father. ROS:   Please see the history of present illness.    All 14 point review of systems negative except as described per history of present illness  EKGs/Labs/Other Studies Reviewed:      Recent Labs: 05/23/2018: BUN 15; Creatinine, Ser 0.80; Potassium 4.2; Sodium 137   Recent Lipid Panel    Component Value Date/Time   CHOL 162 05/23/2018 0814   TRIG 195 (H) 05/23/2018 0814   HDL 28 (L) 05/23/2018 0814   CHOLHDL 5.8 (H) 05/23/2018 0814   LDLCALC 95 05/23/2018 0814    Physical Exam:    VS:  BP 138/72   Pulse (!) 48   Ht 5\' 11"  (1.803 m)   Wt 182 lb (82.6 kg)   SpO2 98%   BMI 25.38 kg/m     Wt Readings from Last 3 Encounters:  11/14/18 182 lb (82.6 kg)  10/28/18 181 lb (82.1 kg)  05/19/18 180 lb (81.6 kg)     GEN:  Well nourished, well developed in no acute distress HEENT: Normal NECK: No JVD; No carotid bruits LYMPHATICS: No lymphadenopathy CARDIAC: RRR, no murmurs, no rubs, no gallops RESPIRATORY:  Clear to auscultation without rales, wheezing or rhonchi  ABDOMEN: Soft, non-tender, non-distended MUSCULOSKELETAL:  No edema; No deformity  SKIN: Warm and dry LOWER EXTREMITIES: no swelling NEUROLOGIC:  Alert and oriented x 3 PSYCHIATRIC:  Normal affect   ASSESSMENT:    1. Dilated cardiomyopathy ([place])   2. Essential hypertension   3. Complete heart block ([place])   4. Dyslipidemia    PLAN:    In order of problems listed above:  1. Dilated cardiomyopathy echocardiogram will be scheduled to see assess left ventricular ejection fraction. 2. Essential hypertension blood pressure well controlled continue present management. 3. Complete heart block.  He does have a pacemaker device will be interrogated today.  Set up at rate of 60 however EKG showing 49 that need to be investigated. 4. Dyslipidemia we will schedule him to have fasting lipid profile.   Medication Adjustments/Labs and Tests Ordered: Current medicines are reviewed at length with the patient today.  Concerns regarding medicines are outlined above.  No orders of the defined types were placed in this encounter.  Medication changes: No orders of the defined types were placed in this encounter.   Signed, Park Liter, MD, [facility] 11/14/2018 9:19 AM    [place]

## 2018-11-14 NOTE — Progress Notes (Signed)
Pacemaker check in clinic, added-on per Dr. Agustin Cree. Normal device function. Thresholds, sensing, impedances consistent with previous measurements. RA bipolar and unipolar impedance chronically low (210-240ohms today), bipolar trend stable, programmed bipolar pace/sense with polarity switch on monitor only. Patient reports he was previously told he has a lead insulation issue. AP 39% with rest rate enabled at 50bpm, hysteresis enabled at 40bpm. Discussed with [employer] rep, as RA lead is capturing and sensing appropriately, no changes recommended at this time. Device programmed to maximize longevity. 8 mode switches (<1%)--AT, longest 10sec. No high ventricular rates noted. 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 No noise with isometrics. Patient educated about EMI, discussed with S[employer] rep, plan to setup appointment at patient's home to check for EMI sources. Device programmed at appropriate safety margins, no changes today. Histogram distribution blunted but appropriate for patient activity level. Estimated longevity 2.4-2.7 years. Patient enrolled in remote follow-up. Patient education completed. Merlin on 11/28/18 and ROV with Dr. Rayann Heman in 10/2019.

## 2018-11-14 NOTE — Patient Instructions (Addendum)
Medication Instructions:  Your physician recommends that you continue on your current medications as directed. Please refer to the Current Medication list given to you today.  *If you need a refill on your cardiac medications before your next appointment, please call your pharmacy*  Lab Work: None.  If you have labs (blood work) drawn today and your tests are completely normal, you will receive your results only by: . MyChart Message (if you have MyChart) OR . A paper copy in the mail If you have any lab test that is abnormal or we need to change your treatment, we will call you to review the results.  Testing/Procedures: Your physician has requested that you have an echocardiogram. Echocardiography is a painless test that uses sound waves to create images of your heart. It provides your doctor with information about the size and shape of your heart and how well your heart's chambers and valves are working. This procedure takes approximately one hour. There are no restrictions for this procedure.    Follow-Up: At [facility], you and your health needs are our priority.  As part of our continuing mission to provide you with exceptional heart care, we have created designated Provider Care Teams.  These Care Teams include your primary Cardiologist (physician) and Advanced Practice Providers (APPs -  Physician Assistants and Nurse Practitioners) who all work together to provide you with the care you need, when you need it.  Your next appointment:   5 months  The format for your next appointment:   In Person  Provider:   Robert Krasowski, MD  Other Instructions   Echocardiogram An echocardiogram is a procedure that uses painless sound waves (ultrasound) to produce an image of the heart. Images from an echocardiogram can provide important information about:  Signs of coronary artery disease (CAD).  Aneurysm detection. An aneurysm is a weak or damaged part of an artery wall that  bulges out from the normal force of blood pumping through the body.  Heart size and shape. Changes in the size or shape of the heart can be associated with certain conditions, including heart failure, aneurysm, and CAD.  Heart muscle function.  Heart valve function.  Signs of a past heart attack.  Fluid buildup around the heart.  Thickening of the heart muscle.  A tumor or infectious growth around the heart valves. Tell a health care provider about:  Any allergies you have.  All medicines you are taking, including vitamins, herbs, eye drops, creams, and over-the-counter medicines.  Any blood disorders you have.  Any surgeries you have had.  Any medical conditions you have.  Whether you are pregnant or may be pregnant. What are the risks? Generally, this is a safe procedure. However, problems may occur, including:  Allergic reaction to dye (contrast) that may be used during the procedure. What happens before the procedure? No specific preparation is needed. You may eat and drink normally. What happens during the procedure?   An IV tube may be inserted into one of your veins.  You may receive contrast through this tube. A contrast is an injection that improves the quality of the pictures from your heart.  A gel will be applied to your chest.  A wand-like tool (transducer) will be moved over your chest. The gel will help to transmit the sound waves from the transducer.  The sound waves will harmlessly bounce off of your heart to allow the heart images to be captured in real-time motion. The images will be recorded   on a computer. The procedure may vary among health care providers and hospitals. What happens after the procedure?  You may return to your normal, everyday life, including diet, activities, and medicines, unless your health care provider tells you not to do that. Summary  An echocardiogram is a procedure that uses painless sound waves (ultrasound) to produce  an image of the heart.  Images from an echocardiogram can provide important information about the size and shape of your heart, heart muscle function, heart valve function, and fluid buildup around your heart.  You do not need to do anything to prepare before this procedure. You may eat and drink normally.  After the echocardiogram is completed, you may return to your normal, everyday life, unless your health care provider tells you not to do that. This information is not intended to replace advice given to you by your health care provider. Make sure you discuss any questions you have with your health care provider. Document Released: 01/03/2000 Document Revised: 04/28/2018 Document Reviewed: 02/08/2016 Elsevier Patient Education  2020 Elsevier Inc.   

## 2018-11-14 NOTE — Addendum Note (Signed)
Addended by: Ashok Norris on: 11/14/2018 09:28 AM   Modules accepted: Orders

## 2018-11-16 ENCOUNTER — Other Ambulatory Visit: Payer: Self-pay | Admitting: Cardiology

## 2018-11-22 ENCOUNTER — Telehealth: Payer: Self-pay | Admitting: *Deleted

## 2018-11-22 NOTE — Telephone Encounter (Signed)
-----   Message from Thompson Grayer, MD sent at 11/14/2018  9:49 PM EDT ----- Would this patient be best followed by Will in [place]?   ----- Message ----- From: Interface, [employer] Sent: 11/14/2018  12:43 PM EDT To: Thompson Grayer, MD

## 2018-11-22 NOTE — Telephone Encounter (Signed)
Spoke with patient's wife (DPR). She will ask pt if he would rather follow-up with Dr. Curt Bears in [place] for future appointments. Direct number given for call back.

## 2018-11-23 NOTE — Telephone Encounter (Signed)
Doing well at [facility] visit. Alvin Lang see in one year.

## 2018-11-23 NOTE — Telephone Encounter (Signed)
Recall, Paceart, and remote transmission appointment notes updated.

## 2018-11-23 NOTE — Telephone Encounter (Signed)
Patient returned call. Confirms he would rather follow-up with Dr. Curt Bears in [place] than having to come to [facility] to see Dr. Rayann Heman. Next f/u is due 10/2019 per recall. Advised pt I will call him back if any additional action is needed from him. He is in agreement with plan.

## 2018-11-28 ENCOUNTER — Ambulatory Visit (INDEPENDENT_AMBULATORY_CARE_PROVIDER_SITE_OTHER): Payer: PPO | Admitting: *Deleted

## 2018-11-28 DIAGNOSIS — I420 Dilated cardiomyopathy: Secondary | ICD-10-CM | POA: Diagnosis not present

## 2018-11-28 DIAGNOSIS — I442 Atrioventricular block, complete: Secondary | ICD-10-CM

## 2018-11-29 LAB — CUP PACEART REMOTE DEVICE CHECK
Battery Remaining Longevity: 32 mo
Battery Remaining Percentage: 25 %
Battery Voltage: 2.8 V
Brady Statistic AP VP Percent: 36 %
Brady Statistic AP VS Percent: 1 %
Brady Statistic AS VP Percent: 63 %
Brady Statistic AS VS Percent: 1 %
Brady Statistic RA Percent Paced: 35 %
Brady Statistic RV Percent Paced: 99 %
Date Time Interrogation Session: 20201109083242
Implantable Lead Implant Date: 20020201
Implantable Lead Implant Date: 20020201
Implantable Lead Location: 753859
Implantable Lead Location: 753860
Implantable Pulse Generator Implant Date: 20121127
Lead Channel Impedance Value: 210 Ohm
Lead Channel Impedance Value: 840 Ohm
Lead Channel Pacing Threshold Amplitude: 0.5 V
Lead Channel Pacing Threshold Amplitude: 1 V
Lead Channel Pacing Threshold Pulse Width: 0.4 ms
Lead Channel Pacing Threshold Pulse Width: 0.4 ms
Lead Channel Sensing Intrinsic Amplitude: 12 mV
Lead Channel Sensing Intrinsic Amplitude: 2.8 mV
Lead Channel Setting Pacing Amplitude: 0.75 V
Lead Channel Setting Pacing Amplitude: 2 V
Lead Channel Setting Pacing Pulse Width: 0.4 ms
Lead Channel Setting Sensing Sensitivity: 5 mV
Pulse Gen Model: 2210
Pulse Gen Serial Number: 7288642

## 2018-12-05 ENCOUNTER — Other Ambulatory Visit: Payer: Self-pay | Admitting: Cardiology

## 2018-12-09 ENCOUNTER — Ambulatory Visit (INDEPENDENT_AMBULATORY_CARE_PROVIDER_SITE_OTHER): Payer: PPO

## 2018-12-09 ENCOUNTER — Other Ambulatory Visit: Payer: Self-pay

## 2018-12-09 DIAGNOSIS — I420 Dilated cardiomyopathy: Secondary | ICD-10-CM

## 2018-12-09 NOTE — Progress Notes (Signed)
Complete echocardiogram has been performed.  Jimmy Trayce Caravello RDCS, RVT 

## 2018-12-28 NOTE — Progress Notes (Signed)
Remote pacemaker transmission.   

## 2019-01-08 ENCOUNTER — Other Ambulatory Visit: Payer: Self-pay | Admitting: Cardiology

## 2019-01-31 DIAGNOSIS — Z8679 Personal history of other diseases of the circulatory system: Secondary | ICD-10-CM | POA: Diagnosis not present

## 2019-01-31 DIAGNOSIS — E663 Overweight: Secondary | ICD-10-CM | POA: Diagnosis not present

## 2019-01-31 DIAGNOSIS — Z79899 Other long term (current) drug therapy: Secondary | ICD-10-CM | POA: Diagnosis not present

## 2019-01-31 DIAGNOSIS — E785 Hyperlipidemia, unspecified: Secondary | ICD-10-CM | POA: Diagnosis not present

## 2019-01-31 DIAGNOSIS — I10 Essential (primary) hypertension: Secondary | ICD-10-CM | POA: Diagnosis not present

## 2019-01-31 DIAGNOSIS — Z6827 Body mass index (BMI) 27.0-27.9, adult: Secondary | ICD-10-CM | POA: Diagnosis not present

## 2019-01-31 DIAGNOSIS — Z9181 History of falling: Secondary | ICD-10-CM | POA: Diagnosis not present

## 2019-01-31 DIAGNOSIS — M1990 Unspecified osteoarthritis, unspecified site: Secondary | ICD-10-CM | POA: Diagnosis not present

## 2019-01-31 DIAGNOSIS — E559 Vitamin D deficiency, unspecified: Secondary | ICD-10-CM | POA: Diagnosis not present

## 2019-02-17 ENCOUNTER — Other Ambulatory Visit: Payer: Self-pay | Admitting: Cardiology

## 2019-02-27 ENCOUNTER — Ambulatory Visit (INDEPENDENT_AMBULATORY_CARE_PROVIDER_SITE_OTHER): Payer: PPO | Admitting: *Deleted

## 2019-02-27 DIAGNOSIS — I420 Dilated cardiomyopathy: Secondary | ICD-10-CM | POA: Diagnosis not present

## 2019-02-28 LAB — CUP PACEART REMOTE DEVICE CHECK
Battery Remaining Longevity: 25 mo
Battery Remaining Percentage: 19 %
Battery Voltage: 2.77 V
Brady Statistic AP VP Percent: 40 %
Brady Statistic AP VS Percent: 1 %
Brady Statistic AS VP Percent: 59 %
Brady Statistic AS VS Percent: 1 %
Brady Statistic RA Percent Paced: 39 %
Brady Statistic RV Percent Paced: 99 %
Date Time Interrogation Session: 20210209020134
Implantable Lead Implant Date: 20020201
Implantable Lead Implant Date: 20020201
Implantable Lead Location: 753859
Implantable Lead Location: 753860
Implantable Pulse Generator Implant Date: 20121127
Lead Channel Impedance Value: 210 Ohm
Lead Channel Impedance Value: 800 Ohm
Lead Channel Pacing Threshold Amplitude: 0.625 V
Lead Channel Pacing Threshold Amplitude: 1 V
Lead Channel Pacing Threshold Pulse Width: 0.4 ms
Lead Channel Pacing Threshold Pulse Width: 0.4 ms
Lead Channel Sensing Intrinsic Amplitude: 12 mV
Lead Channel Sensing Intrinsic Amplitude: 3.4 mV
Lead Channel Setting Pacing Amplitude: 0.875
Lead Channel Setting Pacing Amplitude: 2 V
Lead Channel Setting Pacing Pulse Width: 0.4 ms
Lead Channel Setting Sensing Sensitivity: 5 mV
Pulse Gen Model: 2210
Pulse Gen Serial Number: 7288642

## 2019-02-28 NOTE — Progress Notes (Signed)
PPM Remote  

## 2019-03-08 ENCOUNTER — Other Ambulatory Visit: Payer: Self-pay | Admitting: Family

## 2019-04-28 ENCOUNTER — Encounter: Payer: Self-pay | Admitting: Cardiology

## 2019-04-28 ENCOUNTER — Other Ambulatory Visit: Payer: Self-pay

## 2019-04-28 ENCOUNTER — Ambulatory Visit: Payer: PPO | Admitting: Cardiology

## 2019-04-28 VITALS — BP 136/76 | HR 53 | Temp 97.3°F | Ht 70.0 in | Wt 183.2 lb

## 2019-04-28 DIAGNOSIS — I10 Essential (primary) hypertension: Secondary | ICD-10-CM

## 2019-04-28 DIAGNOSIS — Z950 Presence of cardiac pacemaker: Secondary | ICD-10-CM

## 2019-04-28 DIAGNOSIS — E785 Hyperlipidemia, unspecified: Secondary | ICD-10-CM | POA: Diagnosis not present

## 2019-04-28 DIAGNOSIS — I442 Atrioventricular block, complete: Secondary | ICD-10-CM | POA: Diagnosis not present

## 2019-04-28 DIAGNOSIS — I420 Dilated cardiomyopathy: Secondary | ICD-10-CM

## 2019-04-28 MED ORDER — ATORVASTATIN CALCIUM 20 MG PO TABS: 20.0000 mg | ORAL_TABLET | Freq: Every day | ORAL | 1 refills | Status: DC

## 2019-04-28 NOTE — Patient Instructions (Signed)

## 2019-04-28 NOTE — Progress Notes (Signed)
Cardiology Office Note:    Date:  04/28/2019   ID:  Alvin Lang, DOB December 13, 1945, MRN [id number]  PCP:  Alvin Johns, MD  Cardiologist:  Alvin Campus, MD    Referring MD: Alvin Johns, MD   No chief complaint on file. Doing well  History of Present Illness:    Alvin Lang is a 74 y.o. male with past medical history significant for complete heart block that required pacemaker placement, dilated cardiomyopathy with severely diminished left ventricle ejection fraction 25% but improved significantly with appropriate medication, also dyslipidemia, essential hypertension.  Alvin Lang, today 2 months follow-up.  Overall he is doing well.  Denies having any chest pain, tightness, pressure, burning in the chest.  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  No dizziness no passing out.  Past Medical History:  Diagnosis Date  . Chronic systolic dysfunction of left ventricle   . Complete heart block ([place])   . Dilated cardiomyopathy ([place]) 03/26/2016  . Dyslipidemia 01/31/2015  . Essential hypertension 01/31/2015  . Hyperlipidemia   . Hypertension   . Nonischemic cardiomyopathy ([place])   . Pacemaker 05/19/2018   [employer]  . Pacemaker reprogramming/check 01/31/2015   Overview:  St jude device    Past Surgical History:  Procedure Laterality Date  . PACEMAKER GENERATOR CHANGE Left 12/16/2010   SJM Accent DR RF PPM for complete heart block,  generator change by Dr Agustin Cree  . PACEMAKER IMPLANT  2002   by Dr Agustin Cree    Current Medications: Current Meds  Medication Sig  . atorvastatin (LIPITOR) 20 MG tablet Take 20 mg by mouth daily.  . Calcium Carb-Cholecalciferol (CALCIUM-VITAMIN D) 500-200 MG-UNIT tablet Take 1 tablet by mouth 2 (two) times daily.  . carvedilol (COREG) 25 MG tablet Take 1 tablet by mouth twice daily  . DOCOSAHEXAENOIC ACID PO Take 2 g by mouth daily.  Marland Kitchen ENTRESTO 49-51 MG Take 1 tablet by mouth twice daily  . spironolactone (ALDACTONE) 25 MG  tablet TAKE 1/2 (ONE-HALF) TABLET BY MOUTH ONCE DAILY  . Vitamin D, Ergocalciferol, (DRISDOL) 1.25 MG (50000 UT) CAPS capsule Take 1 capsule by mouth every 21 ( twenty-one) days.      Allergies:   Patient has no known allergies.   Social History   Socioeconomic History  . Marital status: Married    Spouse name: Not on file  . Number of children: Not on file  . Years of education: Not on file  . Highest education level: Not on file  Occupational History  . Not on file  Tobacco Use  . Smoking status: Former Research scientist (life sciences)  . Smokeless tobacco: Never Used  Substance and Sexual Activity  . Alcohol use: No  . Drug use: No  . Sexual activity: Not on file  Other Topics Concern  . Not on file  Social History Narrative  . Not on file   Social Determinants of Health   Financial Resource Strain:   . Difficulty of Paying Living Expenses:   Food Insecurity:   . Worried About Charity fundraiser in the Last Year:   . Arboriculturist in the Last Year:   Transportation Needs:   . Film/video editor (Medical):   Marland Kitchen Lack of Transportation (Non-Medical):   Physical Activity:   . Days of Exercise per Week:   . Minutes of Exercise per Session:   Stress:   . Feeling of Stress :   Social Connections:   . Frequency of  Communication with Friends and Family:   . Frequency of Social Gatherings with Friends and Family:   . Attends Religious Services:   . Active Member of Clubs or Organizations:   . Attends Archivist Meetings:   Marland Kitchen Marital Status:      Family History: The patient's family history includes Diabetes in his father; Heart attack in his mother; Heart disease in his father. ROS:   Please see the history of present illness.    All 14 point review of systems negative except as described per history of present illness  EKGs/Labs/Other Studies Reviewed:      Recent Labs: 05/23/2018: BUN 15; Creatinine, Ser 0.80; Potassium 4.2; Sodium 137  Recent Lipid Panel    Component  Value Date/Time   CHOL 162 05/23/2018 0814   TRIG 195 (H) 05/23/2018 0814   HDL 28 (L) 05/23/2018 0814   CHOLHDL 5.8 (H) 05/23/2018 0814   LDLCALC 95 05/23/2018 0814    Physical Exam:    VS:  BP 136/76   Pulse (!) 53   Temp (!) 97.3 F (36.3 C)   Ht 5\' 10"  (1.778 m)   Wt 183 lb 3.2 oz (83.1 kg)   SpO2 95%   BMI 26.29 kg/m     Wt Readings from Last 3 Encounters:  04/28/19 183 lb 3.2 oz (83.1 kg)  11/14/18 182 lb (82.6 kg)  10/28/18 181 lb (82.1 kg)     GEN:  Well nourished, well developed in no acute distress HEENT: Normal NECK: No JVD; No carotid bruits LYMPHATICS: No lymphadenopathy CARDIAC: RRR, no murmurs, no rubs, no gallops RESPIRATORY:  Clear to auscultation without rales, wheezing or rhonchi  ABDOMEN: Soft, non-tender, non-distended MUSCULOSKELETAL:  No edema; No deformity  SKIN: Warm and dry LOWER EXTREMITIES: no swelling NEUROLOGIC:  Alert and oriented x 3 PSYCHIATRIC:  Normal affect   ASSESSMENT:    1. Complete heart block ([place])   2. Dilated cardiomyopathy ([place])   3. Essential hypertension   4. Dyslipidemia   5. Pacemaker    PLAN:    In order of problems listed above:  1. Complete heart block has been addressed with pacemaker device is functioning properly we will continue present management. 2. Dilated cardiomyopathy last estimation of ejection fraction 45%.  He is on Entresto Aldactone as well as Coreg which I will continue.  He is doing well hemodynamically stable, New York Heart Association class I/II. 3. Essential hypertension blood pressure well controlled we will continue present management. 4. Dyslipidemia: I did review K PN which showed me his last LDL of 107 with HDL of 32.  He is on Lipitor 20 which I will continue. 5. Pacemaker present device interrogation reviewed.  Normal function.   Medication Adjustments/Labs and Tests Ordered: Current medicines are reviewed at length with the patient today.  Concerns regarding medicines are  outlined above.  No orders of the defined types were placed in this encounter.  Medication changes: No orders of the defined types were placed in this encounter.   Signed, Park Liter, MD, [facility] 04/28/2019 9:14 AM    [place]

## 2019-05-03 DIAGNOSIS — M1990 Unspecified osteoarthritis, unspecified site: Secondary | ICD-10-CM | POA: Diagnosis not present

## 2019-05-03 DIAGNOSIS — Z8679 Personal history of other diseases of the circulatory system: Secondary | ICD-10-CM | POA: Diagnosis not present

## 2019-05-03 DIAGNOSIS — I10 Essential (primary) hypertension: Secondary | ICD-10-CM | POA: Diagnosis not present

## 2019-05-03 DIAGNOSIS — E785 Hyperlipidemia, unspecified: Secondary | ICD-10-CM | POA: Diagnosis not present

## 2019-05-03 DIAGNOSIS — Z6827 Body mass index (BMI) 27.0-27.9, adult: Secondary | ICD-10-CM | POA: Diagnosis not present

## 2019-05-03 DIAGNOSIS — E559 Vitamin D deficiency, unspecified: Secondary | ICD-10-CM | POA: Diagnosis not present

## 2019-05-03 DIAGNOSIS — E663 Overweight: Secondary | ICD-10-CM | POA: Diagnosis not present

## 2019-05-11 ENCOUNTER — Other Ambulatory Visit: Payer: Self-pay | Admitting: Cardiology

## 2019-05-29 ENCOUNTER — Ambulatory Visit (INDEPENDENT_AMBULATORY_CARE_PROVIDER_SITE_OTHER): Payer: PPO | Admitting: *Deleted

## 2019-05-29 DIAGNOSIS — I442 Atrioventricular block, complete: Secondary | ICD-10-CM

## 2019-05-29 LAB — CUP PACEART REMOTE DEVICE CHECK
Battery Remaining Longevity: 18 mo
Battery Remaining Percentage: 15 %
Battery Voltage: 2.74 V
Brady Statistic AP VP Percent: 43 %
Brady Statistic AP VS Percent: 1 %
Brady Statistic AS VP Percent: 55 %
Brady Statistic AS VS Percent: 1 %
Brady Statistic RA Percent Paced: 42 %
Brady Statistic RV Percent Paced: 98 %
Date Time Interrogation Session: 20210510033747
Implantable Lead Implant Date: 20020201
Implantable Lead Implant Date: 20020201
Implantable Lead Location: 753859
Implantable Lead Location: 753860
Implantable Pulse Generator Implant Date: 20121127
Lead Channel Impedance Value: 240 Ohm
Lead Channel Impedance Value: 750 Ohm
Lead Channel Pacing Threshold Amplitude: 0.625 V
Lead Channel Pacing Threshold Amplitude: 1 V
Lead Channel Pacing Threshold Pulse Width: 0.4 ms
Lead Channel Pacing Threshold Pulse Width: 0.4 ms
Lead Channel Sensing Intrinsic Amplitude: 12 mV
Lead Channel Sensing Intrinsic Amplitude: 3.4 mV
Lead Channel Setting Pacing Amplitude: 0.875
Lead Channel Setting Pacing Amplitude: 2 V
Lead Channel Setting Pacing Pulse Width: 0.4 ms
Lead Channel Setting Sensing Sensitivity: 5 mV
Pulse Gen Model: 2210
Pulse Gen Serial Number: 7288642

## 2019-05-30 NOTE — Progress Notes (Signed)
Remote pacemaker transmission.   

## 2019-06-27 DIAGNOSIS — Z0000 Encounter for general adult medical examination without abnormal findings: Secondary | ICD-10-CM | POA: Diagnosis not present

## 2019-06-27 DIAGNOSIS — Z9181 History of falling: Secondary | ICD-10-CM | POA: Diagnosis not present

## 2019-06-27 DIAGNOSIS — Z1331 Encounter for screening for depression: Secondary | ICD-10-CM | POA: Diagnosis not present

## 2019-06-27 DIAGNOSIS — E785 Hyperlipidemia, unspecified: Secondary | ICD-10-CM | POA: Diagnosis not present

## 2019-07-19 DIAGNOSIS — H04123 Dry eye syndrome of bilateral lacrimal glands: Secondary | ICD-10-CM | POA: Diagnosis not present

## 2019-07-19 DIAGNOSIS — H33321 Round hole, right eye: Secondary | ICD-10-CM | POA: Diagnosis not present

## 2019-07-19 DIAGNOSIS — H353112 Nonexudative age-related macular degeneration, right eye, intermediate dry stage: Secondary | ICD-10-CM | POA: Diagnosis not present

## 2019-07-19 DIAGNOSIS — H353121 Nonexudative age-related macular degeneration, left eye, early dry stage: Secondary | ICD-10-CM | POA: Diagnosis not present

## 2019-08-16 ENCOUNTER — Other Ambulatory Visit: Payer: Self-pay | Admitting: Cardiology

## 2019-08-28 ENCOUNTER — Ambulatory Visit (INDEPENDENT_AMBULATORY_CARE_PROVIDER_SITE_OTHER): Payer: PPO | Admitting: *Deleted

## 2019-08-28 DIAGNOSIS — I442 Atrioventricular block, complete: Secondary | ICD-10-CM

## 2019-08-29 DIAGNOSIS — Z8679 Personal history of other diseases of the circulatory system: Secondary | ICD-10-CM | POA: Diagnosis not present

## 2019-08-29 DIAGNOSIS — E785 Hyperlipidemia, unspecified: Secondary | ICD-10-CM | POA: Diagnosis not present

## 2019-08-29 DIAGNOSIS — E559 Vitamin D deficiency, unspecified: Secondary | ICD-10-CM | POA: Diagnosis not present

## 2019-08-29 DIAGNOSIS — E663 Overweight: Secondary | ICD-10-CM | POA: Diagnosis not present

## 2019-08-29 DIAGNOSIS — I10 Essential (primary) hypertension: Secondary | ICD-10-CM | POA: Diagnosis not present

## 2019-08-29 DIAGNOSIS — M1990 Unspecified osteoarthritis, unspecified site: Secondary | ICD-10-CM | POA: Diagnosis not present

## 2019-08-29 DIAGNOSIS — Z6827 Body mass index (BMI) 27.0-27.9, adult: Secondary | ICD-10-CM | POA: Diagnosis not present

## 2019-08-29 DIAGNOSIS — Z862 Personal history of diseases of the blood and blood-forming organs and certain disorders involving the immune mechanism: Secondary | ICD-10-CM | POA: Diagnosis not present

## 2019-08-30 LAB — CUP PACEART REMOTE DEVICE CHECK
Battery Remaining Longevity: 12 mo
Battery Remaining Percentage: 10 %
Battery Voltage: 2.71 V
Brady Statistic AP VP Percent: 43 %
Brady Statistic AP VS Percent: 1 %
Brady Statistic AS VP Percent: 56 %
Brady Statistic AS VS Percent: 1 %
Brady Statistic RA Percent Paced: 42 %
Brady Statistic RV Percent Paced: 99 %
Date Time Interrogation Session: 20210809020720
Implantable Lead Implant Date: 20020201
Implantable Lead Implant Date: 20020201
Implantable Lead Location: 753859
Implantable Lead Location: 753860
Implantable Pulse Generator Implant Date: 20121127
Lead Channel Impedance Value: 210 Ohm
Lead Channel Impedance Value: 900 Ohm
Lead Channel Pacing Threshold Amplitude: 0.625 V
Lead Channel Pacing Threshold Amplitude: 1 V
Lead Channel Pacing Threshold Pulse Width: 0.4 ms
Lead Channel Pacing Threshold Pulse Width: 0.4 ms
Lead Channel Sensing Intrinsic Amplitude: 12 mV
Lead Channel Sensing Intrinsic Amplitude: 2.7 mV
Lead Channel Setting Pacing Amplitude: 0.875
Lead Channel Setting Pacing Amplitude: 2 V
Lead Channel Setting Pacing Pulse Width: 0.4 ms
Lead Channel Setting Sensing Sensitivity: 5 mV
Pulse Gen Model: 2210
Pulse Gen Serial Number: 7288642

## 2019-08-31 NOTE — Progress Notes (Signed)
Remote pacemaker transmission.   

## 2019-09-04 ENCOUNTER — Other Ambulatory Visit: Payer: Self-pay | Admitting: Cardiology

## 2019-10-02 ENCOUNTER — Other Ambulatory Visit: Payer: Self-pay | Admitting: Cardiology

## 2019-11-11 ENCOUNTER — Other Ambulatory Visit: Payer: Self-pay | Admitting: Cardiology

## 2019-11-27 ENCOUNTER — Ambulatory Visit (INDEPENDENT_AMBULATORY_CARE_PROVIDER_SITE_OTHER): Payer: PPO

## 2019-11-27 DIAGNOSIS — I442 Atrioventricular block, complete: Secondary | ICD-10-CM

## 2019-11-28 DIAGNOSIS — I428 Other cardiomyopathies: Secondary | ICD-10-CM | POA: Insufficient documentation

## 2019-11-28 DIAGNOSIS — I519 Heart disease, unspecified: Secondary | ICD-10-CM | POA: Insufficient documentation

## 2019-11-28 DIAGNOSIS — I10 Essential (primary) hypertension: Secondary | ICD-10-CM | POA: Insufficient documentation

## 2019-11-28 DIAGNOSIS — E785 Hyperlipidemia, unspecified: Secondary | ICD-10-CM | POA: Insufficient documentation

## 2019-11-29 ENCOUNTER — Telehealth: Payer: Self-pay | Admitting: Cardiology

## 2019-11-29 ENCOUNTER — Ambulatory Visit: Payer: PPO | Admitting: Cardiology

## 2019-11-29 ENCOUNTER — Other Ambulatory Visit: Payer: Self-pay

## 2019-11-29 ENCOUNTER — Telehealth: Payer: Self-pay

## 2019-11-29 ENCOUNTER — Encounter: Payer: Self-pay | Admitting: Cardiology

## 2019-11-29 VITALS — BP 160/88 | HR 50 | Ht 70.0 in | Wt 181.0 lb

## 2019-11-29 DIAGNOSIS — I10 Essential (primary) hypertension: Secondary | ICD-10-CM

## 2019-11-29 DIAGNOSIS — Z950 Presence of cardiac pacemaker: Secondary | ICD-10-CM | POA: Diagnosis not present

## 2019-11-29 DIAGNOSIS — I428 Other cardiomyopathies: Secondary | ICD-10-CM | POA: Diagnosis not present

## 2019-11-29 DIAGNOSIS — E782 Mixed hyperlipidemia: Secondary | ICD-10-CM | POA: Diagnosis not present

## 2019-11-29 DIAGNOSIS — I442 Atrioventricular block, complete: Secondary | ICD-10-CM

## 2019-11-29 LAB — CUP PACEART REMOTE DEVICE CHECK
Battery Remaining Longevity: 8 mo
Battery Remaining Percentage: 6 %
Battery Voltage: 2.68 V
Brady Statistic AP VP Percent: 43 %
Brady Statistic AP VS Percent: 1 %
Brady Statistic AS VP Percent: 56 %
Brady Statistic AS VS Percent: 1 %
Brady Statistic RA Percent Paced: 43 %
Brady Statistic RV Percent Paced: 99 %
Date Time Interrogation Session: 20211108013936
Implantable Lead Implant Date: 20020201
Implantable Lead Implant Date: 20020201
Implantable Lead Location: 753859
Implantable Lead Location: 753860
Implantable Pulse Generator Implant Date: 20121127
Lead Channel Impedance Value: 210 Ohm
Lead Channel Impedance Value: 830 Ohm
Lead Channel Pacing Threshold Amplitude: 0.5 V
Lead Channel Pacing Threshold Amplitude: 1 V
Lead Channel Pacing Threshold Pulse Width: 0.4 ms
Lead Channel Pacing Threshold Pulse Width: 0.4 ms
Lead Channel Sensing Intrinsic Amplitude: 2.9 mV
Lead Channel Sensing Intrinsic Amplitude: 8.6 mV
Lead Channel Setting Pacing Amplitude: 0.75 V
Lead Channel Setting Pacing Amplitude: 2 V
Lead Channel Setting Pacing Pulse Width: 0.4 ms
Lead Channel Setting Sensing Sensitivity: 5 mV
Pulse Gen Model: 2210
Pulse Gen Serial Number: 7288642

## 2019-11-29 NOTE — Telephone Encounter (Signed)
Patient returned call to discuss transmission. He would like an additional call to discuss further.

## 2019-11-29 NOTE — Telephone Encounter (Signed)
I did not call the patient.

## 2019-11-29 NOTE — Progress Notes (Signed)
Remote pacemaker transmission.   

## 2019-11-29 NOTE — Telephone Encounter (Signed)
Scheduled remote transmission received, battery is estimated 7.9 months until ERI.  LVM for pt advising frequency of remote transmissions updated to monthly with next check on 01/01/20.

## 2019-11-29 NOTE — Progress Notes (Signed)
Cardiology Office Note:    Date:  11/29/2019   ID:  Alvin Lang, DOB 12-27-1945, MRN [id number]  PCP:  Alvin Johns, MD  Cardiologist:  Alvin Campus, MD    Referring MD: Alvin Johns, MD   Chief Complaint  Patient presents with  . Follow-up  I am doing fine  History of Present Illness:    Alvin Lang is a 74 y.o. 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  Last echocardiogram done year ago showed ejection fraction about 45%.  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  Overall he is doing well denies have any chest pain tightness squeezing pressure burning chest.  Still working hard in his garden and property and have no difficulty doing it.  She does have some shortness of breath as usual.  Past Medical History:  Diagnosis Date  . Chronic systolic dysfunction of left ventricle   . Complete heart block ([place])   . Dilated cardiomyopathy ([place]) 03/26/2016  . Dyslipidemia 01/31/2015  . Essential hypertension 01/31/2015  . Hyperlipidemia   . Hypertension   . Nonischemic cardiomyopathy ([place])   . Pacemaker 05/19/2018   St.Jude  . Pacemaker reprogramming/check 01/31/2015   Overview:  St jude device    Past Surgical History:  Procedure Laterality Date  . PACEMAKER GENERATOR CHANGE Left 12/16/2010   SJM [employer]ent DR RF PPM for complete heart block,  generator change by Dr Agustin Cree  . PACEMAKER IMPLANT  2002   by Dr Agustin Cree    Current Medications: Current Meds  Medication Sig  . atorvastatin (LIPITOR) 20 MG tablet Take 1 tablet by mouth once daily  . Calcium Carb-Cholecalciferol (CALCIUM-VITAMIN D) 500-200 MG-UNIT tablet Take 1 tablet by mouth 2 (two) times daily.  . carvedilol (COREG) 25 MG tablet Take 1 tablet by mouth twice daily  . ENTRESTO 49-51 MG Take 1 tablet by mouth twice daily  . omega-3 fish oil (MAXEPA) 1000  MG CAPS capsule Take 2 capsules by mouth daily.  Marland Kitchen spironolactone (ALDACTONE) 25 MG tablet TAKE 1/2 TABLET BY MOUTH ONCE DAILY  . Vitamin D, Ergocalciferol, (DRISDOL) 1.25 MG (50000 UT) CAPS capsule Take 1 capsule by mouth every 21 ( twenty-one) days.      Allergies:   Patient has no known allergies.   Social History   Socioeconomic History  . Marital status: Married    Spouse name: Not on file  . Number of children: Not on file  . Years of education: Not on file  . Highest education level: Not on file  Occupational History  . Not on file  Tobacco Use  . Smoking status: Former Research scientist (life sciences)  . Smokeless tobacco: Never Used  Vaping Use  . Vaping Use: Never used  Substance and Sexual Activity  . Alcohol use: No  . Drug use: No  . Sexual activity: Not on file  Other Topics Concern  . Not on file  Social History Narrative  . Not on file   Social Determinants of Health   Financial Resource Strain:   . Difficulty of Paying Living Expenses: Not on file  Food Insecurity:   . Worried About Charity fundraiser in the Last Year: Not on file  . Ran Out of Food in the Last Year: Not on file  Transportation Needs:   . Lack of Transportation (Medical): Not on file  . Lack of Transportation (Non-Medical): Not on file  Physical Activity:   . Days of Exercise per Week: Not on file  . Minutes of Exercise per Session: Not on file  Stress:   . Feeling of Stress : Not on file  Social Connections:   . Frequency of Communication with Friends and Family: Not on file  . Frequency of Social Gatherings with Friends and Family: Not on file  . Attends Religious Services: Not on file  . Active Member of Clubs or Organizations: Not on file  . Attends Archivist Meetings: Not on file  . Marital Status: Not on file     Family History: The patient's family history includes Diabetes in his father; Heart attack in his mother; Heart disease in his father. ROS:   Please see the history of  present illness.    All 14 point review of systems negative except as described per history of present illness  EKGs/Labs/Other Studies Reviewed:      Recent Labs: No results found for requested labs within last 8760 hours.  Recent Lipid Panel    Component Value Date/Time   CHOL 162 05/23/2018 0814   TRIG 195 (H) 05/23/2018 0814   HDL 28 (L) 05/23/2018 0814   CHOLHDL 5.8 (H) 05/23/2018 0814   LDLCALC 95 05/23/2018 0814    Physical Exam:    VS:  BP (!) 160/88 (BP Location: Left Arm, Patient Position: Sitting, Cuff Size: Normal)   Pulse (!) 50   Ht 5\' 10"  (1.778 m)   Wt 181 lb (82.1 kg)   SpO2 95%   BMI 25.97 kg/m     Wt Readings from Last 3 Encounters:  11/29/19 181 lb (82.1 kg)  04/28/19 183 lb 3.2 oz (83.1 kg)  11/14/18 182 lb (82.6 kg)     GEN:  Well nourished, well developed in no acute distress HEENT: Normal NECK: No JVD; No carotid bruits LYMPHATICS: No lymphadenopathy CARDIAC: RRR, no murmurs, no rubs, no gallops RESPIRATORY:  Clear to auscultation without rales, wheezing or rhonchi  ABDOMEN: Soft, non-tender, non-distended MUSCULOSKELETAL:  No edema; No deformity  SKIN: Warm and dry LOWER EXTREMITIES: no swelling NEUROLOGIC:  Alert and oriented x 3 PSYCHIATRIC:  Normal affect   ASSESSMENT:    1. Nonischemic cardiomyopathy ([place])   2. Essential hypertension   3. Complete heart block ([employer])   4. Pacemaker   5. Mixed hyperlipidemia    PLAN:    In order of problems listed above:  1. Nonischemic cardiomyopathy plan to repeat his echocardiogram which I will do.  He will be scheduled to have an echocardiogram.  In the meantime we will continue with Entresto Aldactone as well as carvedilol.  I asked him to have Chem-7 done today. 2. Essential hypertension blood pressure well controlled continue present management. 3. Complete heart block this being addressed with the device for pacemaker. 4. Pacemaker present EKG done today showed DDD mode with VAT pacing  however heart rate is only 50.  I will make arrangements for remote interrogation of the device.  We coming close to ERI however he still got about 10% of battery left. 5. Mixed dyslipidemia: We will check his fasting lipid profile.   Medication Adjustments/Labs and Tests Ordered: Current medicines are reviewed at length with the patient today.  Concerns regarding medicines are outlined above.  No orders of the defined types were placed in this encounter.  Medication changes: No orders of the defined types were placed in this encounter.   Signed, Park Liter, MD, [facility] 11/29/2019 4:54 PM  [facility]

## 2019-11-29 NOTE — Telephone Encounter (Signed)
New Message:      Pt said he received 2 calls from here this morning, but he does not know who called him.

## 2019-11-29 NOTE — Telephone Encounter (Signed)
Spoke with pt explained ERI and the need for monthly checks.  Noted pt is due for in-clinic visit with Dr. Curt Bears.  Will send to scheduling to make appt.

## 2019-11-29 NOTE — Patient Instructions (Signed)
Medication Instructions:  None *If you need a refill on your cardiac medications before your next appointment, please call your pharmacy*   Lab Work: BMP Lipids If you have labs (blood work) drawn today and your tests are completely normal, you will receive your results only by: Marland Kitchen MyChart Message (if you have MyChart) OR . A paper copy in the mail If you have any lab test that is abnormal or we need to change your treatment, we will call you to review the results.   Testing/Procedures: Echocardiogram   Follow-Up: At [facility], you and your health needs are our priority.  As part of our continuing mission to provide you with exceptional heart care, we have created designated Provider Care Teams.  These Care Teams include your primary Cardiologist (physician) and Advanced Practice Providers (APPs -  Physician Assistants and Nurse Practitioners) who all work together to provide you with the care you need, when you need it.  We recommend signing up for the patient portal called "MyChart".  Sign up information is provided on this After Visit Summary.  MyChart is used to connect with patients for Virtual Visits (Telemedicine).  Patients are able to view lab/test results, encounter notes, upcoming appointments, etc.  Non-urgent messages can be sent to your provider as well.   To learn more about what you can do with MyChart, go to [email or web address].    Your next appointment:   6 month(s)  The format for your next appointment:   In Person  Provider:   Jenne Campus, MD   Other Instructions  Echocardiogram An echocardiogram is a procedure that uses painless sound waves (ultrasound) to produce an image of the heart. Images from an echocardiogram can provide important information about:  Signs of coronary artery disease (CAD).  Aneurysm detection. An aneurysm is a weak or damaged part of an artery wall that bulges out from the normal force of blood pumping through the  body.  Heart size and shape. Changes in the size or shape of the heart can be associated with certain conditions, including heart failure, aneurysm, and CAD.  Heart muscle function.  Heart valve function.  Signs of a past heart attack.  Fluid buildup around the heart.  Thickening of the heart muscle.  A tumor or infectious growth around the heart valves. Tell a health care provider about:  Any allergies you have.  All medicines you are taking, including vitamins, herbs, eye drops, creams, and over-the-counter medicines.  Any blood disorders you have.  Any surgeries you have had.  Any medical conditions you have.  Whether you are pregnant or may be pregnant. What are the risks? Generally, this is a safe procedure. However, problems may occur, including:  Allergic reaction to dye (contrast) that may be used during the procedure. What happens before the procedure? No specific preparation is needed. You may eat and drink normally. What happens during the procedure?   An IV tube may be inserted into one of your veins.  You may receive contrast through this tube. A contrast is an injection that improves the quality of the pictures from your heart.  A gel will be applied to your chest.  A wand-like tool (transducer) will be moved over your chest. The gel will help to transmit the sound waves from the transducer.  The sound waves will harmlessly bounce off of your heart to allow the heart images to be captured in real-time motion. The images will be recorded on a computer. The procedure may vary  among health care providers and hospitals. What happens after the procedure?  You may return to your normal, everyday life, including diet, activities, and medicines, unless your health care provider tells you not to do that. Summary  An echocardiogram is a procedure that uses painless sound waves (ultrasound) to produce an image of the heart.  Images from an echocardiogram can  provide important information about the size and shape of your heart, heart muscle function, heart valve function, and fluid buildup around your heart.  You do not need to do anything to prepare before this procedure. You may eat and drink normally.  After the echocardiogram is completed, you may return to your normal, everyday life, unless your health care provider tells you not to do that. This information is not intended to replace advice given to you by your health care provider. Make sure you discuss any questions you have with your health care provider. Document Revised: 04/28/2018 Document Reviewed: 02/08/2016 Elsevier Patient Education  [place].

## 2019-11-30 LAB — BASIC METABOLIC PANEL
BUN/Creatinine Ratio: 17 (ref 10–24)
BUN: 15 mg/dL (ref 8–27)
CO2: 21 mmol/L (ref 20–29)
Calcium: 8.9 mg/dL (ref 8.6–10.2)
Chloride: 103 mmol/L (ref 96–106)
Creatinine, Ser: 0.86 mg/dL (ref 0.76–1.27)
GFR calc Af Amer: 97 mL/min/{1.73_m2} (ref >59)
GFR calc non Af Amer: 84 mL/min/{1.73_m2} (ref >59)
Glucose: 99 mg/dL (ref 65–99)
Potassium: 4.3 mmol/L (ref 3.5–5.2)
Sodium: 136 mmol/L (ref 134–144)

## 2019-11-30 LAB — LIPID PANEL
Chol/HDL Ratio: 4.6 ratio (ref 0.0–5.0)
Cholesterol, Total: 148 mg/dL (ref 100–199)
HDL: 32 mg/dL — ABNORMAL LOW (ref >39)
LDL Chol Calc (NIH): 83 mg/dL (ref 0–99)
Triglycerides: 194 mg/dL — ABNORMAL HIGH (ref 0–149)
VLDL Cholesterol Cal: 33 mg/dL (ref 5–40)

## 2019-11-30 NOTE — Telephone Encounter (Signed)
Calling patient to request manual transmission to check presenting rhythm.  Per note, patient was seen by Dr. Agustin Cree and stated EKG showed Paced at 50 although device is programmed 60 LRL.   Presenting 11/30/19 AS/VP 50's. Consulted with Zebulon, states Hysteresis is turned on and allows patients resting rate to lower, if lowers more than 10 minutes pacing will start with an increase to 60 bpm.

## 2019-12-01 ENCOUNTER — Telehealth: Payer: Self-pay

## 2019-12-01 NOTE — Telephone Encounter (Signed)
Patient returning ann's call, states that he will listen to voicemail.

## 2019-12-01 NOTE — Telephone Encounter (Signed)
Got it,  thank you, no need to change anything he is completely asymptomatic with it

## 2019-12-05 DIAGNOSIS — E785 Hyperlipidemia, unspecified: Secondary | ICD-10-CM | POA: Diagnosis not present

## 2019-12-05 DIAGNOSIS — E559 Vitamin D deficiency, unspecified: Secondary | ICD-10-CM | POA: Diagnosis not present

## 2019-12-05 DIAGNOSIS — Z6827 Body mass index (BMI) 27.0-27.9, adult: Secondary | ICD-10-CM | POA: Diagnosis not present

## 2019-12-05 DIAGNOSIS — M1990 Unspecified osteoarthritis, unspecified site: Secondary | ICD-10-CM | POA: Diagnosis not present

## 2019-12-05 DIAGNOSIS — Z139 Encounter for screening, unspecified: Secondary | ICD-10-CM | POA: Diagnosis not present

## 2019-12-05 DIAGNOSIS — Z79899 Other long term (current) drug therapy: Secondary | ICD-10-CM | POA: Diagnosis not present

## 2019-12-05 DIAGNOSIS — E663 Overweight: Secondary | ICD-10-CM | POA: Diagnosis not present

## 2019-12-05 DIAGNOSIS — Z23 Encounter for immunization: Secondary | ICD-10-CM | POA: Diagnosis not present

## 2019-12-05 DIAGNOSIS — I10 Essential (primary) hypertension: Secondary | ICD-10-CM | POA: Diagnosis not present

## 2019-12-05 DIAGNOSIS — Z8679 Personal history of other diseases of the circulatory system: Secondary | ICD-10-CM | POA: Diagnosis not present

## 2019-12-25 ENCOUNTER — Other Ambulatory Visit: Payer: Self-pay

## 2019-12-25 ENCOUNTER — Encounter: Payer: Self-pay | Admitting: Cardiology

## 2019-12-25 ENCOUNTER — Ambulatory Visit (INDEPENDENT_AMBULATORY_CARE_PROVIDER_SITE_OTHER): Payer: PPO | Admitting: Cardiology

## 2019-12-25 VITALS — BP 140/76 | HR 60 | Ht 71.0 in | Wt 181.8 lb

## 2019-12-25 DIAGNOSIS — I442 Atrioventricular block, complete: Secondary | ICD-10-CM

## 2019-12-25 LAB — CUP PACEART INCLINIC DEVICE CHECK
Battery Remaining Longevity: 6 mo
Battery Voltage: 2.66 V
Brady Statistic RA Percent Paced: 42 %
Brady Statistic RV Percent Paced: 99.2 %
Date Time Interrogation Session: 20211206100700
Implantable Lead Implant Date: 20020201
Implantable Lead Implant Date: 20020201
Implantable Lead Location: 753859
Implantable Lead Location: 753860
Implantable Pulse Generator Implant Date: 20121127
Lead Channel Impedance Value: 212.5 Ohm
Lead Channel Impedance Value: 800 Ohm
Lead Channel Pacing Threshold Amplitude: 0.5 V
Lead Channel Pacing Threshold Amplitude: 1 V
Lead Channel Pacing Threshold Pulse Width: 0.4 ms
Lead Channel Pacing Threshold Pulse Width: 0.4 ms
Lead Channel Sensing Intrinsic Amplitude: 2.5 mV
Lead Channel Setting Pacing Amplitude: 0.75 V
Lead Channel Setting Pacing Amplitude: 2 V
Lead Channel Setting Pacing Pulse Width: 0.4 ms
Lead Channel Setting Sensing Sensitivity: 5 mV
Pulse Gen Model: 2210
Pulse Gen Serial Number: 7288642

## 2019-12-25 NOTE — Progress Notes (Signed)
Electrophysiology Office Note   Date:  12/25/2019   ID:  Alvin Lang, DOB 03/08/1945, MRN [id number]  PCP:  Nicholos Johns, MD  Cardiologist:  Agustin Cree Primary Electrophysiologist:  Chayil Gantt Meredith Leeds, MD    Chief Complaint: pacemaker   History of Present Illness: Alvin Lang is a 74 y.o. male who is being seen today for the evaluation of pacemaker at the request of Nicholos Johns, MD. Presenting today for electrophysiology evaluation.  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  He has an ejection fraction of 40 to 45%.    Today, he denies symptoms of palpitations, chest pain, shortness of breath, orthopnea, PND, lower extremity edema, claudication, dizziness, presyncope, syncope, bleeding, or neurologic sequela. The patient is tolerating medications without difficulties.  He is currently feeling well.  He has no awareness of his pacemaker.  He is able to do all of his daily activities without restriction.   Past Medical History:  Diagnosis Date  . Chronic systolic dysfunction of left ventricle   . Complete heart block ([place])   . Dilated cardiomyopathy ([place]) 03/26/2016  . Dyslipidemia 01/31/2015  . Essential hypertension 01/31/2015  . Hyperlipidemia   . Hypertension   . Nonischemic cardiomyopathy ([place])   . Pacemaker 05/19/2018   St.Jude  . Pacemaker reprogramming/check 01/31/2015   Overview:  St jude device   Past Surgical History:  Procedure Laterality Date  . PACEMAKER GENERATOR CHANGE Left 12/16/2010   SJM Accent DR RF PPM for complete heart block,  generator change by Dr Agustin Cree  . PACEMAKER IMPLANT  2002   by Dr Agustin Cree     Current Outpatient Medications  Medication Sig Dispense Refill  . Calcium Carb-Cholecalciferol (CALCIUM-VITAMIN D) 500-200 MG-UNIT tablet Take 1 tablet by mouth 2 (two) times daily.    . carvedilol (COREG) 25 MG tablet Take 1 tablet by mouth twice  daily 180 tablet 2  . ENTRESTO 49-51 MG Take 1 tablet by mouth twice daily 180 tablet 1  . omega-3 fish oil (MAXEPA) 1000 MG CAPS capsule Take 2 capsules by mouth daily.    Marland Kitchen spironolactone (ALDACTONE) 25 MG tablet TAKE 1/2 TABLET BY MOUTH ONCE DAILY 45 tablet 1  . Vitamin D, Ergocalciferol, (DRISDOL) 1.25 MG (50000 UT) CAPS capsule Take 1 capsule by mouth every 21 ( twenty-one) days.     Marland Kitchen atorvastatin (LIPITOR) 20 MG tablet Take 1 tablet by mouth once daily (Patient not taking: Reported on 12/25/2019) 90 tablet 0   No current facility-administered medications for this visit.    Allergies:   Patient has no known allergies.   Social History:  The patient  reports that he has quit smoking. He has never used smokeless tobacco. He reports that he does not drink alcohol and does not use drugs.   Family History:  The patient's family history includes Diabetes in his father; Heart attack in his mother; Heart disease in his father.    ROS:  Please see the history of present illness.   Otherwise, review of systems is positive for none.   All other systems are reviewed and negative.    PHYSICAL EXAM: VS:  BP 140/76   Pulse 60   Ht 5\' 11"  (1.803 m)   Wt 181 lb 12.8 oz (82.5 kg)   SpO2 97%   BMI 25.36 kg/m  , BMI Body mass index is 25.36 kg/m. GEN: Well nourished, well developed, in no acute distress  HEENT: normal  Neck: no JVD, carotid bruits, or masses Cardiac: RRR; no murmurs, rubs, or gallops,no edema  Respiratory:  clear to auscultation bilaterally, normal work of breathing GI: soft, nontender, nondistended, + BS MS: no deformity or atrophy  Skin: warm and dry, device pocket is well healed Neuro:  Strength and sensation are intact Psych: euthymic mood, full affect  EKG:  EKG is not ordered today. Personal review of the ekg ordered 11/30/19 shows A sense, V paced  Device interrogation is reviewed today in detail.  See PaceArt for details.   Recent Labs: 11/29/2019: BUN 15;  Creatinine, Ser 0.86; Potassium 4.3; Sodium 136    Lipid Panel     Component Value Date/Time   CHOL 148 11/29/2019 1700   TRIG 194 (H) 11/29/2019 1700   HDL 32 (L) 11/29/2019 1700   CHOLHDL 4.6 11/29/2019 1700   LDLCALC 83 11/29/2019 1700     Wt Readings from Last 3 Encounters:  12/25/19 181 lb 12.8 oz (82.5 kg)  11/29/19 181 lb (82.1 kg)  04/28/19 183 lb 3.2 oz (83.1 kg)      Other studies Reviewed: Additional studies/ records that were reviewed today include: TTE 12/09/18  Review of the above records today demonstrates:  1. Left ventricular ejection fraction, by visual estimation, is 45 to  50%. The left ventricle has normal function. Left ventricular septal wall  thickness was moderately increased. Moderately increased left ventricular  posterior wall thickness. There is  moderately increased left ventricular hypertrophy.  2. Abnormal septal motion consistent with RV pacemaker.  3. Left ventricular diastolic parameters are consistent with Grade II  diastolic dysfunction (pseudonormalization).  4. Global right ventricle has normal systolic function.The right  ventricular size is normal. No increase in right ventricular wall  thickness.  5. Left atrial size was mildly dilated.  6. Right atrial size was normal.  7. The mitral valve is normal in structure. Mild mitral valve  regurgitation. No evidence of mitral stenosis.  8. The tricuspid valve is normal in structure. Tricuspid valve  regurgitation is not demonstrated.  9. The aortic valve is normal in structure. Aortic valve regurgitation is  not visualized. No evidence of aortic valve sclerosis or stenosis.  10. The pulmonic valve was normal in structure. Pulmonic valve  regurgitation is mild.  11. 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  12. A pacer wire is visualized.  13. Normal pulmonary artery systolic pressure.    ASSESSMENT AND PLAN:  1.  Complete heart block: Status post  [facility] Jude dual-chamber pacemaker.  Device functioning appropriately.  He has a low ejection fraction, though he has not wanted device upgrade.  His device is nearing ERI.  He would be amenable to device upgrade if it was performed at the time of his generator change.  His atrial impedance is also low but has remained stable.  2.  Nonischemic cardiomyopathy: Currently on Entresto, Aldactone, carvedilol.  He has a repeat echo planned for later this month.  3.  Hypertension: Blood pressure elevated today but in the 120s over 60s at home.  No changes.  Case discussed with primary cardiology  Current medicines are reviewed at length with the patient today.   The patient does not have concerns regarding his medicines.  The following changes were made today:  none  Labs/ tests ordered today include:  No orders of the defined types were placed in this encounter.    Disposition:   FU with Jaydon Soroka 1 year  Signed, Adlene Adduci  Meredith Leeds, MD  12/25/2019 9:36 AM     [facility] [address] [place] [address] [phone] (office) [phone] (fax)

## 2019-12-28 ENCOUNTER — Other Ambulatory Visit: Payer: Self-pay

## 2019-12-28 ENCOUNTER — Ambulatory Visit (INDEPENDENT_AMBULATORY_CARE_PROVIDER_SITE_OTHER): Payer: PPO

## 2019-12-28 DIAGNOSIS — I428 Other cardiomyopathies: Secondary | ICD-10-CM | POA: Diagnosis not present

## 2019-12-28 LAB — ECHOCARDIOGRAM COMPLETE
Calc EF: 50.9 %
S' Lateral: 3 cm
Single Plane A2C EF: 54 %
Single Plane A4C EF: 49.1 %

## 2019-12-28 NOTE — Progress Notes (Signed)
Complete echocardiogram performed.  Jimmy Ovella Manygoats RDCS, RVT  

## 2020-01-01 ENCOUNTER — Ambulatory Visit (INDEPENDENT_AMBULATORY_CARE_PROVIDER_SITE_OTHER): Payer: PPO

## 2020-01-01 DIAGNOSIS — I442 Atrioventricular block, complete: Secondary | ICD-10-CM

## 2020-01-01 LAB — CUP PACEART REMOTE DEVICE CHECK
Battery Remaining Longevity: 6 mo
Battery Remaining Percentage: 5 %
Battery Voltage: 2.66 V
Brady Statistic AP VP Percent: 34 %
Brady Statistic AP VS Percent: 0 %
Brady Statistic AS VP Percent: 66 %
Brady Statistic AS VS Percent: 1 %
Brady Statistic RA Percent Paced: 34 %
Brady Statistic RV Percent Paced: 99 %
Date Time Interrogation Session: 20211213035548
Implantable Lead Implant Date: 20020201
Implantable Lead Implant Date: 20020201
Implantable Lead Location: 753859
Implantable Lead Location: 753860
Implantable Pulse Generator Implant Date: 20121127
Lead Channel Impedance Value: 210 Ohm
Lead Channel Impedance Value: 760 Ohm
Lead Channel Pacing Threshold Amplitude: 0.5 V
Lead Channel Pacing Threshold Amplitude: 1 V
Lead Channel Pacing Threshold Pulse Width: 0.4 ms
Lead Channel Pacing Threshold Pulse Width: 0.4 ms
Lead Channel Sensing Intrinsic Amplitude: 2.2 mV
Lead Channel Sensing Intrinsic Amplitude: 8.6 mV
Lead Channel Setting Pacing Amplitude: 0.75 V
Lead Channel Setting Pacing Amplitude: 2 V
Lead Channel Setting Pacing Pulse Width: 0.4 ms
Lead Channel Setting Sensing Sensitivity: 5 mV
Pulse Gen Model: 2210
Pulse Gen Serial Number: 7288642

## 2020-01-15 NOTE — Progress Notes (Signed)
Remote pacemaker transmission.   

## 2020-02-01 ENCOUNTER — Ambulatory Visit (INDEPENDENT_AMBULATORY_CARE_PROVIDER_SITE_OTHER): Payer: PPO

## 2020-02-01 DIAGNOSIS — I442 Atrioventricular block, complete: Secondary | ICD-10-CM

## 2020-02-01 LAB — CUP PACEART REMOTE DEVICE CHECK
Battery Remaining Longevity: 4 mo
Battery Remaining Percentage: 3 %
Battery Voltage: 2.65 V
Brady Statistic AP VP Percent: 33 %
Brady Statistic AP VS Percent: 1 %
Brady Statistic AS VP Percent: 67 %
Brady Statistic AS VS Percent: 1 %
Brady Statistic RA Percent Paced: 33 %
Brady Statistic RV Percent Paced: 99 %
Date Time Interrogation Session: 20220113020444
Implantable Lead Implant Date: 20020201
Implantable Lead Implant Date: 20020201
Implantable Lead Location: 753859
Implantable Lead Location: 753860
Implantable Pulse Generator Implant Date: 20121127
Lead Channel Impedance Value: 210 Ohm
Lead Channel Impedance Value: 610 Ohm
Lead Channel Pacing Threshold Amplitude: 0.625 V
Lead Channel Pacing Threshold Amplitude: 1 V
Lead Channel Pacing Threshold Pulse Width: 0.4 ms
Lead Channel Pacing Threshold Pulse Width: 0.4 ms
Lead Channel Sensing Intrinsic Amplitude: 2.7 mV
Lead Channel Sensing Intrinsic Amplitude: 8.6 mV
Lead Channel Setting Pacing Amplitude: 0.875
Lead Channel Setting Pacing Amplitude: 2 V
Lead Channel Setting Pacing Pulse Width: 0.4 ms
Lead Channel Setting Sensing Sensitivity: 5 mV
Pulse Gen Model: 2210
Pulse Gen Serial Number: 7288642

## 2020-02-15 NOTE — Progress Notes (Signed)
Remote pacemaker transmission.   

## 2020-02-26 ENCOUNTER — Other Ambulatory Visit: Payer: Self-pay | Admitting: Cardiology

## 2020-03-03 LAB — CUP PACEART REMOTE DEVICE CHECK
Battery Remaining Longevity: 1 mo
Battery Remaining Percentage: 1 %
Battery Voltage: 2.63 V
Brady Statistic AP VP Percent: 39 %
Brady Statistic AP VS Percent: 1 %
Brady Statistic AS VP Percent: 61 %
Brady Statistic AS VS Percent: 1 %
Brady Statistic RA Percent Paced: 38 %
Brady Statistic RV Percent Paced: 99 %
Date Time Interrogation Session: 20220213021611
Implantable Lead Implant Date: 20020201
Implantable Lead Implant Date: 20020201
Implantable Lead Location: 753859
Implantable Lead Location: 753860
Implantable Pulse Generator Implant Date: 20121127
Lead Channel Impedance Value: 240 Ohm
Lead Channel Impedance Value: 740 Ohm
Lead Channel Pacing Threshold Amplitude: 0.625 V
Lead Channel Pacing Threshold Amplitude: 1 V
Lead Channel Pacing Threshold Pulse Width: 0.4 ms
Lead Channel Pacing Threshold Pulse Width: 0.4 ms
Lead Channel Sensing Intrinsic Amplitude: 12 mV
Lead Channel Sensing Intrinsic Amplitude: 3.2 mV
Lead Channel Setting Pacing Amplitude: 0.875
Lead Channel Setting Pacing Amplitude: 2 V
Lead Channel Setting Pacing Pulse Width: 0.4 ms
Lead Channel Setting Sensing Sensitivity: 5 mV
Pulse Gen Model: 2210
Pulse Gen Serial Number: 7288642

## 2020-03-04 ENCOUNTER — Ambulatory Visit (INDEPENDENT_AMBULATORY_CARE_PROVIDER_SITE_OTHER): Payer: PPO

## 2020-03-04 DIAGNOSIS — I442 Atrioventricular block, complete: Secondary | ICD-10-CM | POA: Diagnosis not present

## 2020-03-07 DIAGNOSIS — I10 Essential (primary) hypertension: Secondary | ICD-10-CM | POA: Diagnosis not present

## 2020-03-07 DIAGNOSIS — E663 Overweight: Secondary | ICD-10-CM | POA: Diagnosis not present

## 2020-03-07 DIAGNOSIS — Z8679 Personal history of other diseases of the circulatory system: Secondary | ICD-10-CM | POA: Diagnosis not present

## 2020-03-07 DIAGNOSIS — E559 Vitamin D deficiency, unspecified: Secondary | ICD-10-CM | POA: Diagnosis not present

## 2020-03-07 DIAGNOSIS — E785 Hyperlipidemia, unspecified: Secondary | ICD-10-CM | POA: Diagnosis not present

## 2020-03-07 DIAGNOSIS — M1990 Unspecified osteoarthritis, unspecified site: Secondary | ICD-10-CM | POA: Diagnosis not present

## 2020-03-07 DIAGNOSIS — Z862 Personal history of diseases of the blood and blood-forming organs and certain disorders involving the immune mechanism: Secondary | ICD-10-CM | POA: Diagnosis not present

## 2020-03-07 DIAGNOSIS — Z6826 Body mass index (BMI) 26.0-26.9, adult: Secondary | ICD-10-CM | POA: Diagnosis not present

## 2020-03-07 NOTE — Progress Notes (Signed)
Remote pacemaker transmission.   

## 2020-04-04 ENCOUNTER — Ambulatory Visit (INDEPENDENT_AMBULATORY_CARE_PROVIDER_SITE_OTHER): Payer: PPO

## 2020-04-04 DIAGNOSIS — I442 Atrioventricular block, complete: Secondary | ICD-10-CM

## 2020-04-04 LAB — CUP PACEART REMOTE DEVICE CHECK
Battery Remaining Longevity: 1 mo
Battery Remaining Percentage: 0.5 %
Battery Voltage: 2.62 V
Brady Statistic AP VP Percent: 40 %
Brady Statistic AP VS Percent: 1 %
Brady Statistic AS VP Percent: 60 %
Brady Statistic AS VS Percent: 1 %
Brady Statistic RA Percent Paced: 39 %
Brady Statistic RV Percent Paced: 99 %
Date Time Interrogation Session: 20220317032801
Implantable Lead Implant Date: 20020201
Implantable Lead Implant Date: 20020201
Implantable Lead Location: 753859
Implantable Lead Location: 753860
Implantable Pulse Generator Implant Date: 20121127
Lead Channel Impedance Value: 210 Ohm
Lead Channel Impedance Value: 730 Ohm
Lead Channel Pacing Threshold Amplitude: 0.625 V
Lead Channel Pacing Threshold Amplitude: 1 V
Lead Channel Pacing Threshold Pulse Width: 0.4 ms
Lead Channel Pacing Threshold Pulse Width: 0.4 ms
Lead Channel Sensing Intrinsic Amplitude: 12 mV
Lead Channel Sensing Intrinsic Amplitude: 2.9 mV
Lead Channel Setting Pacing Amplitude: 0.875
Lead Channel Setting Pacing Amplitude: 2 V
Lead Channel Setting Pacing Pulse Width: 0.4 ms
Lead Channel Setting Sensing Sensitivity: 5 mV
Pulse Gen Model: 2210
Pulse Gen Serial Number: 7288642

## 2020-04-12 NOTE — Progress Notes (Signed)
Remote pacemaker transmission.   

## 2020-05-06 ENCOUNTER — Ambulatory Visit (INDEPENDENT_AMBULATORY_CARE_PROVIDER_SITE_OTHER): Payer: PPO

## 2020-05-06 DIAGNOSIS — I442 Atrioventricular block, complete: Secondary | ICD-10-CM

## 2020-05-08 LAB — CUP PACEART REMOTE DEVICE CHECK
Battery Remaining Longevity: 1 mo
Battery Remaining Percentage: 0.5 %
Battery Voltage: 2.59 V
Brady Statistic AP VP Percent: 40 %
Brady Statistic AP VS Percent: 1 %
Brady Statistic AS VP Percent: 60 %
Brady Statistic AS VS Percent: 1 %
Brady Statistic RA Percent Paced: 39 %
Brady Statistic RV Percent Paced: 99 %
Date Time Interrogation Session: 20220418033952
Implantable Lead Implant Date: 20020201
Implantable Lead Implant Date: 20020201
Implantable Lead Location: 753859
Implantable Lead Location: 753860
Implantable Pulse Generator Implant Date: 20121127
Lead Channel Impedance Value: 210 Ohm
Lead Channel Impedance Value: 810 Ohm
Lead Channel Pacing Threshold Amplitude: 0.625 V
Lead Channel Pacing Threshold Amplitude: 1 V
Lead Channel Pacing Threshold Pulse Width: 0.4 ms
Lead Channel Pacing Threshold Pulse Width: 0.4 ms
Lead Channel Sensing Intrinsic Amplitude: 12 mV
Lead Channel Sensing Intrinsic Amplitude: 2.5 mV
Lead Channel Setting Pacing Amplitude: 0.875
Lead Channel Setting Pacing Amplitude: 2 V
Lead Channel Setting Pacing Pulse Width: 0.4 ms
Lead Channel Setting Sensing Sensitivity: 5 mV
Pulse Gen Model: 2210
Pulse Gen Serial Number: 7288642

## 2020-05-10 ENCOUNTER — Other Ambulatory Visit: Payer: Self-pay | Admitting: Cardiology

## 2020-05-22 NOTE — Progress Notes (Signed)
Remote pacemaker transmission.   

## 2020-05-22 NOTE — Addendum Note (Signed)
Addended by: Douglass Rivers D on: 05/22/2020 11:16 AM   Modules accepted: Level of Service

## 2020-06-06 ENCOUNTER — Ambulatory Visit (INDEPENDENT_AMBULATORY_CARE_PROVIDER_SITE_OTHER): Payer: PPO

## 2020-06-06 ENCOUNTER — Telehealth: Payer: Self-pay | Admitting: Emergency Medicine

## 2020-06-06 DIAGNOSIS — Z8679 Personal history of other diseases of the circulatory system: Secondary | ICD-10-CM | POA: Diagnosis not present

## 2020-06-06 DIAGNOSIS — R300 Dysuria: Secondary | ICD-10-CM | POA: Diagnosis not present

## 2020-06-06 DIAGNOSIS — I10 Essential (primary) hypertension: Secondary | ICD-10-CM | POA: Diagnosis not present

## 2020-06-06 DIAGNOSIS — E663 Overweight: Secondary | ICD-10-CM | POA: Diagnosis not present

## 2020-06-06 DIAGNOSIS — Z79899 Other long term (current) drug therapy: Secondary | ICD-10-CM | POA: Diagnosis not present

## 2020-06-06 DIAGNOSIS — I442 Atrioventricular block, complete: Secondary | ICD-10-CM

## 2020-06-06 DIAGNOSIS — E559 Vitamin D deficiency, unspecified: Secondary | ICD-10-CM | POA: Diagnosis not present

## 2020-06-06 DIAGNOSIS — E785 Hyperlipidemia, unspecified: Secondary | ICD-10-CM | POA: Diagnosis not present

## 2020-06-06 DIAGNOSIS — M1990 Unspecified osteoarthritis, unspecified site: Secondary | ICD-10-CM | POA: Diagnosis not present

## 2020-06-06 LAB — CUP PACEART REMOTE DEVICE CHECK
Battery Remaining Longevity: 0 mo
Battery Voltage: 2.57 V
Brady Statistic AP VP Percent: 40 %
Brady Statistic AP VS Percent: 1 %
Brady Statistic AS VP Percent: 60 %
Brady Statistic AS VS Percent: 1 %
Brady Statistic RA Percent Paced: 40 %
Brady Statistic RV Percent Paced: 99 %
Date Time Interrogation Session: 20220519035155
Implantable Lead Implant Date: 20020201
Implantable Lead Implant Date: 20020201
Implantable Lead Location: 753859
Implantable Lead Location: 753860
Implantable Pulse Generator Implant Date: 20121127
Lead Channel Impedance Value: 210 Ohm
Lead Channel Impedance Value: 680 Ohm
Lead Channel Pacing Threshold Amplitude: 0.625 V
Lead Channel Pacing Threshold Amplitude: 1 V
Lead Channel Pacing Threshold Pulse Width: 0.4 ms
Lead Channel Pacing Threshold Pulse Width: 0.4 ms
Lead Channel Sensing Intrinsic Amplitude: 12 mV
Lead Channel Sensing Intrinsic Amplitude: 2.8 mV
Lead Channel Setting Pacing Amplitude: 0.875
Lead Channel Setting Pacing Amplitude: 2 V
Lead Channel Setting Pacing Pulse Width: 0.4 ms
Lead Channel Setting Sensing Sensitivity: 5 mV
Pulse Gen Model: 2210
Pulse Gen Serial Number: 7288642

## 2020-06-06 NOTE — Telephone Encounter (Signed)
Pt returning nurse call. I let him speak with Portia,  rn.

## 2020-06-06 NOTE — Telephone Encounter (Signed)
LMOM to call device clinic with # and hours. Patient reached ERI 06/05/20 on ST Jude ppm. Dr Curt Bears would like to see patient to discuss gen change even though discussed at 12/25/19 appointment.Marland Kitchen

## 2020-06-06 NOTE — Telephone Encounter (Signed)
Patient returning call. Informed patient that his device was at [facility] as of 06/05/20. Per Dr. Curt Bears patient needs to be seen in clinic to discuss gen change prior to scheduling. Patient agreeable to plan. Will forward to scheduling.

## 2020-06-24 ENCOUNTER — Other Ambulatory Visit: Payer: Self-pay

## 2020-06-24 ENCOUNTER — Ambulatory Visit: Payer: PPO | Admitting: Cardiology

## 2020-06-24 ENCOUNTER — Encounter: Payer: Self-pay | Admitting: Cardiology

## 2020-06-24 VITALS — BP 134/80 | HR 55 | Ht 71.0 in | Wt 176.0 lb

## 2020-06-24 DIAGNOSIS — Z01812 Encounter for preprocedural laboratory examination: Secondary | ICD-10-CM | POA: Diagnosis not present

## 2020-06-24 DIAGNOSIS — I442 Atrioventricular block, complete: Secondary | ICD-10-CM

## 2020-06-24 DIAGNOSIS — I428 Other cardiomyopathies: Secondary | ICD-10-CM

## 2020-06-24 DIAGNOSIS — Z01818 Encounter for other preprocedural examination: Secondary | ICD-10-CM

## 2020-06-24 LAB — BASIC METABOLIC PANEL
BUN/Creatinine Ratio: 18 (ref 10–24)
BUN: 14 mg/dL (ref 8–27)
CO2: 26 mmol/L (ref 20–29)
Calcium: 9.2 mg/dL (ref 8.6–10.2)
Chloride: 103 mmol/L (ref 96–106)
Creatinine, Ser: 0.76 mg/dL (ref 0.76–1.27)
Glucose: 102 mg/dL — ABNORMAL HIGH (ref 65–99)
Potassium: 4.4 mmol/L (ref 3.5–5.2)
Sodium: 139 mmol/L (ref 134–144)
eGFR: 93 mL/min/{1.73_m2} (ref >59)

## 2020-06-24 LAB — CBC
Hematocrit: 39.3 % (ref 37.5–51.0)
Hemoglobin: 13.4 g/dL (ref 13.0–17.7)
MCH: 31.5 pg (ref 26.6–33.0)
MCHC: 34.1 g/dL (ref 31.5–35.7)
MCV: 93 fL (ref 79–97)
Platelets: 99 10*3/uL — CL (ref 150–450)
RBC: 4.25 x10E6/uL (ref 4.14–5.80)
RDW: 13.7 % (ref 11.6–15.4)
WBC: 6.9 10*3/uL (ref 3.4–10.8)

## 2020-06-24 NOTE — H&P (View-Only) (Signed)
Electrophysiology Office Note   Date:  06/24/2020   ID:  Alvin Lang, DOB 1945-02-04, MRN [id number]  PCP:  Nicholos Johns, MD  Cardiologist:  Agustin Cree Primary Electrophysiologist:  Nagi Furio Meredith Leeds, MD    Chief Complaint: pacemaker   History of Present Illness: Alvin Lang is a 75 y.o. male who is being seen today for the evaluation of pacemaker at the request of Nicholos Johns, MD. Presenting today for electrophysiology evaluation.  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  He is status post Public house manager.    Today, denies symptoms of palpitations, chest pain, shortness of breath, orthopnea, PND, lower extremity edema, claudication, dizziness, presyncope, syncope, bleeding, or neurologic sequela. The patient is tolerating medications without difficulties.     Past Medical History:  Diagnosis Date  . Chronic systolic dysfunction of left ventricle   . Complete heart block ([place])   . Dilated cardiomyopathy ([place]) 03/26/2016  . Dyslipidemia 01/31/2015  . Essential hypertension 01/31/2015  . Hyperlipidemia   . Hypertension   . Nonischemic cardiomyopathy ([place])   . Pacemaker 05/19/2018   St.Jude  . Pacemaker reprogramming/check 01/31/2015   Overview:  St jude device   Past Surgical History:  Procedure Laterality Date  . PACEMAKER GENERATOR CHANGE Left 12/16/2010   SJM [facility] DR RF PPM for complete heart block,  generator change by Dr Agustin Cree  . PACEMAKER IMPLANT  2002   by Dr Agustin Cree     Current Outpatient Medications  Medication Sig Dispense Refill  . atorvastatin (LIPITOR) 20 MG tablet Take 1 tablet by mouth once daily 90 tablet 2  . Calcium Carb-Cholecalciferol (CALCIUM-VITAMIN D) 500-200 MG-UNIT tablet Take 1 tablet by mouth 2 (two) times daily.    . carvedilol (COREG) 25 MG tablet Take 1 tablet by mouth twice daily 180 tablet 1  . ENTRESTO 49-51 MG  Take 1 tablet by mouth twice daily 180 tablet 2  . omega-3 fish oil (MAXEPA) 1000 MG CAPS capsule Take 2 capsules by mouth daily.    Marland Kitchen spironolactone (ALDACTONE) 25 MG tablet Take 1/2 (one-half) tablet by mouth once daily 45 tablet 1  . Vitamin D, Ergocalciferol, (DRISDOL) 1.25 MG (50000 UT) CAPS capsule Take 1 capsule by mouth every 21 ( twenty-one) days.      No current facility-administered medications for this visit.    Allergies:   Patient has no known allergies.   Social History:  The patient  reports that he has quit smoking. He has never used smokeless tobacco. He reports that he does not drink alcohol and does not use drugs.   Family History:  The patient's family history includes Diabetes in his father; Heart attack in his mother; Heart disease in his father.   ROS:  Please see the history of present illness.   Otherwise, review of systems is positive for none.   All other systems are reviewed and negative.   PHYSICAL EXAM: VS:  BP 134/80   Pulse (!) 55   Ht 5\' 11"  (1.803 m)   Wt 176 lb (79.8 kg)   BMI 24.55 kg/m  , BMI Body mass index is 24.55 kg/m. GEN: Well nourished, well developed, in no acute distress  HEENT: normal  Neck: no JVD, carotid bruits, or masses Cardiac: RRR; no murmurs, rubs, or gallops,no edema  Respiratory:  clear to auscultation bilaterally, normal work of breathing GI: soft, nontender, nondistended, + BS MS: no deformity or atrophy  Skin: warm and dry, device site well healed Neuro:  Strength and sensation are intact Psych: euthymic mood, full affect  EKG:  EKG is ordered today. Personal review of the ekg ordered shows AV paced, rate 55  Personal review of the device interrogation today. Results in [place]: 11/29/2019: BUN 15; Creatinine, Ser 0.86; Potassium 4.3; Sodium 136    Lipid Panel     Component Value Date/Time   CHOL 148 11/29/2019 1700   TRIG 194 (H) 11/29/2019 1700   HDL 32 (L) 11/29/2019 1700   CHOLHDL 4.6  11/29/2019 1700   LDLCALC 83 11/29/2019 1700     Wt Readings from Last 3 Encounters:  06/24/20 176 lb (79.8 kg)  12/25/19 181 lb 12.8 oz (82.5 kg)  11/29/19 181 lb (82.1 kg)      Other studies Reviewed: Additional studies/ records that were reviewed today include: TTE 12/28/2019 Review of the above records today demonstrates:  1. Left ventricular ejection fraction, by estimation, is 45 to 50%. The  left ventricle has mildly decreased function. The left ventricle  demonstrates global hypokinesis. There is moderate concentric left  ventricular hypertrophy. Left ventricular  diastolic parameters are consistent with Grade I diastolic dysfunction  (impaired relaxation).  2. Right ventricular systolic function is normal. The right ventricular  size is normal. There is normal pulmonary artery systolic pressure.  3. Left atrial size was mildly dilated.  4. The mitral valve is normal in structure. Mild mitral valve  regurgitation. No evidence of mitral stenosis.  5. The aortic valve is normal in structure. Aortic valve regurgitation is  not visualized. Mild aortic valve sclerosis is present, with no evidence  of aortic valve stenosis.  6. The inferior vena cava is normal in size with greater than 50%  respiratory variability, suggesting right atrial pressure of 3 mmHg.    ASSESSMENT AND PLAN:  1.  Complete heart block: Status post [facility] Jude dual-chamber pacemaker.  Device functioning appropriately.  Device is at [facility].  We Maveric Debono plan for generator change.  Due to his low ejection fraction, he would benefit from device upgrade.  We Jailyne Chieffo plan for CRT upgrade.  Risks and benefits were discussed including bleeding, tamponade, infection, pneumothorax, among others.  He understands these risks and is agreed to the procedure.    2.  Nonischemic cardiomyopathy: Currently on Entresto, Aldactone, carvedilol.  No volume overload.  3.  Hypertension: currently well controlled   Current  medicines are reviewed at length with the patient today.   The patient does not have concerns regarding his medicines.  The following changes were made today:  none  Labs/ tests ordered today include:  Orders Placed This Encounter  Procedures  . Basic metabolic panel  . CBC  . EKG 12-Lead     Disposition:   FU with Marketta Valadez 3 months  Signed, Ebony Yorio Meredith Leeds, MD  06/24/2020 1:44 PM     [address] [place] [address] [phone] (office) [phone] (fax)

## 2020-06-24 NOTE — Progress Notes (Signed)
Electrophysiology Office Note   Date:  06/24/2020   ID:  Alvin Lang, DOB 02-21-1945, MRN [id number]  PCP:  Nicholos Johns, MD  Cardiologist:  Agustin Cree Primary Electrophysiologist:  Joaquina Nissen Meredith Leeds, MD    Chief Complaint: pacemaker   History of Present Illness: Alvin Lang is a 75 y.o. male who is being seen today for the evaluation of pacemaker at the request of Nicholos Johns, MD. Presenting today for electrophysiology evaluation.  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  He is status post Public house manager.    Today, denies symptoms of palpitations, chest pain, shortness of breath, orthopnea, PND, lower extremity edema, claudication, dizziness, presyncope, syncope, bleeding, or neurologic sequela. The patient is tolerating medications without difficulties.     Past Medical History:  Diagnosis Date  . Chronic systolic dysfunction of left ventricle   . Complete heart block ([place])   . Dilated cardiomyopathy ([place]) 03/26/2016  . Dyslipidemia 01/31/2015  . Essential hypertension 01/31/2015  . Hyperlipidemia   . Hypertension   . Nonischemic cardiomyopathy ([place])   . Pacemaker 05/19/2018   St.Jude  . Pacemaker reprogramming/check 01/31/2015   Overview:  St jude device   Past Surgical History:  Procedure Laterality Date  . PACEMAKER GENERATOR CHANGE Left 12/16/2010   SJM Accent DR RF PPM for complete heart block,  generator change by Dr Agustin Cree  . PACEMAKER IMPLANT  2002   by Dr Agustin Cree     Current Outpatient Medications  Medication Sig Dispense Refill  . atorvastatin (LIPITOR) 20 MG tablet Take 1 tablet by mouth once daily 90 tablet 2  . Calcium Carb-Cholecalciferol (CALCIUM-VITAMIN D) 500-200 MG-UNIT tablet Take 1 tablet by mouth 2 (two) times daily.    . carvedilol (COREG) 25 MG tablet Take 1 tablet by mouth twice daily 180 tablet 1  . ENTRESTO 49-51 MG  Take 1 tablet by mouth twice daily 180 tablet 2  . omega-3 fish oil (MAXEPA) 1000 MG CAPS capsule Take 2 capsules by mouth daily.    Marland Kitchen spironolactone (ALDACTONE) 25 MG tablet Take 1/2 (one-half) tablet by mouth once daily 45 tablet 1  . Vitamin D, Ergocalciferol, (DRISDOL) 1.25 MG (50000 UT) CAPS capsule Take 1 capsule by mouth every 21 ( twenty-one) days.      No current facility-administered medications for this visit.    Allergies:   Patient has no known allergies.   Social History:  The patient  reports that he has quit smoking. He has never used smokeless tobacco. He reports that he does not drink alcohol and does not use drugs.   Family History:  The patient's family history includes Diabetes in his father; Heart attack in his mother; Heart disease in his father.   ROS:  Please see the history of present illness.   Otherwise, review of systems is positive for none.   All other systems are reviewed and negative.   PHYSICAL EXAM: VS:  BP 134/80   Pulse (!) 55   Ht 5\' 11"  (1.803 m)   Wt 176 lb (79.8 kg)   BMI 24.55 kg/m  , BMI Body mass index is 24.55 kg/m. GEN: Well nourished, well developed, in no acute distress  HEENT: normal  Neck: no JVD, carotid bruits, or masses Cardiac: RRR; no murmurs, rubs, or gallops,no edema  Respiratory:  clear to auscultation bilaterally, normal work of breathing GI: soft, nontender, nondistended, + BS MS: no deformity or atrophy  Skin: warm and dry, device site well healed Neuro:  Strength and sensation are intact Psych: euthymic mood, full affect  EKG:  EKG is ordered today. Personal review of the ekg ordered shows AV paced, rate 55  Personal review of the device interrogation today. Results in [place]: 11/29/2019: BUN 15; Creatinine, Ser 0.86; Potassium 4.3; Sodium 136    Lipid Panel     Component Value Date/Time   CHOL 148 11/29/2019 1700   TRIG 194 (H) 11/29/2019 1700   HDL 32 (L) 11/29/2019 1700   CHOLHDL 4.6  11/29/2019 1700   LDLCALC 83 11/29/2019 1700     Wt Readings from Last 3 Encounters:  06/24/20 176 lb (79.8 kg)  12/25/19 181 lb 12.8 oz (82.5 kg)  11/29/19 181 lb (82.1 kg)      Other studies Reviewed: Additional studies/ records that were reviewed today include: TTE 12/28/2019 Review of the above records today demonstrates:  1. Left ventricular ejection fraction, by estimation, is 45 to 50%. The  left ventricle has mildly decreased function. The left ventricle  demonstrates global hypokinesis. There is moderate concentric left  ventricular hypertrophy. Left ventricular  diastolic parameters are consistent with Grade I diastolic dysfunction  (impaired relaxation).  2. Right ventricular systolic function is normal. The right ventricular  size is normal. There is normal pulmonary artery systolic pressure.  3. Left atrial size was mildly dilated.  4. The mitral valve is normal in structure. Mild mitral valve  regurgitation. No evidence of mitral stenosis.  5. The aortic valve is normal in structure. Aortic valve regurgitation is  not visualized. Mild aortic valve sclerosis is present, with no evidence  of aortic valve stenosis.  6. The inferior vena cava is normal in size with greater than 50%  respiratory variability, suggesting right atrial pressure of 3 mmHg.    ASSESSMENT AND PLAN:  1.  Complete heart block: Status post [facility] Jude dual-chamber pacemaker.  Device functioning appropriately.  Device is at [facility].  We Tyren Dugar plan for generator change.  Due to his low ejection fraction, he would benefit from device upgrade.  We Reshonda Koerber plan for CRT upgrade.  Risks and benefits were discussed including bleeding, tamponade, infection, pneumothorax, among others.  He understands these risks and is agreed to the procedure.    2.  Nonischemic cardiomyopathy: Currently on Entresto, Aldactone, carvedilol.  No volume overload.  3.  Hypertension: currently well controlled   Current  medicines are reviewed at length with the patient today.   The patient does not have concerns regarding his medicines.  The following changes were made today:  none  Labs/ tests ordered today include:  Orders Placed This Encounter  Procedures  . Basic metabolic panel  . CBC  . EKG 12-Lead     Disposition:   FU with Meilah Delrosario 3 months  Signed, Tyann Niehaus Meredith Leeds, MD  06/24/2020 1:44 PM     [address] [place] [address] [phone] (office) [phone] (fax)

## 2020-06-24 NOTE — Patient Instructions (Addendum)
Medication Instructions:  Your physician recommends that you continue on your current medications as directed. Please refer to the Current Medication list given to you today.  *If you need a refill on your cardiac medications before your next appointment, please call your pharmacy*   Lab Work: None ordered If you have labs (blood work) drawn today and your tests are completely normal, you will receive your results only by: Marland Kitchen MyChart Message (if you have MyChart) OR . A paper copy in the mail If you have any lab test that is abnormal or we need to change your treatment, we will call you to review the results.   Testing/Procedures: Your physician has recommended that you have a pacemaker generator (battery) change. Please see the instruction sheet below located under "other instructions".    Follow-Up: At [facility], you and your health needs are our priority.  As part of our continuing mission to provide you with exceptional heart care, we have created designated Provider Care Teams.  These Care Teams include your primary Cardiologist (physician) and Advanced Practice Providers (APPs -  Physician Assistants and Nurse Practitioners) who all work together to provide you with the care you need, when you need it.  We recommend signing up for the patient portal called "MyChart".  Sign up information is provided on this After Visit Summary.  MyChart is used to connect with patients for Virtual Visits (Telemedicine).  Patients are able to view lab/test results, encounter notes, upcoming appointments, etc.  Non-urgent messages can be sent to your provider as well.   To learn more about what you can do with MyChart, go to [email or web address].    Your next appointment:   2 weeks after your pacemaker upgrade/battery change on 06/26/20  The format for your next appointment:   In Person  Provider:   device clinic for a wound check    Thank you for choosing [facility]!!   Trinidad Curet, RN [phone]   Other Instructions   Implantable Device Instructions  You are scheduled for: Pacemaker battery (generator) change on 06/26/2020 with Dr. Curt Bears.  1.   Pre procedure testing-             A.  LAB WORK--- On 06/24/2020  for your pre procedure blood work.  You do NOT need to be fasting.   2. On the day of your procedure 06/26/2020 you will go to [facility] [phone]) at 11:30 am.  Dennis Bast will go to the main entrance [address] [employer]) and enter where the [employer] are.  You will check in at ADMITTING.  You may have one support person come in to the hospital with you.  They will be asked to wait in the waiting room.   3.   Do not eat or drink after midnight prior to your procedure.   4.   On the morning of your procedure hold your Entresto and Spironolactone.  You may take your remaining morning medications with a sip of water (enough to get the medications down)  5.  The night before your procedure and the morning of your procedure scrub your neck/chest with surgical scrub.  See instruction letter.   5.  Plan for an overnight stay, but you may be discharged home after your procedure. If you use your phone frequently bring your phone charger, in case you have to stay.  If you are discharged after your procedure you will need someone to drive you home and  be with your for 24 hours after your procedure.   6.  You will follow up with the [place] clinic 10-14 days after your procedure. You will follow up with Dr. Curt Bears 91 days after your procedure.  These appointments will be made for you.   * If you have ANY questions after you get home, please call the office [phone] and ask for Madoc Holquin RN or send a MyChart message.    [place] - Preparing For Surgery (surgical scrub)  Before surgery, you can play an important role. Because skin is not sterile, your skin needs to be as free of germs as possible. You can reduce the number of  germs on your skin by washing with CHG (chlorahexidine gluconate) Soap before surgery.  CHG is an antiseptic cleaner which kills germs and bonds with the skin to continue killing germs even after washing.   Please do not use if you have an allergy to CHG or antibacterial soaps.  If your skin becomes reddened/irritated stop using the CHG.   Do not shave (including legs and underarms) for at least 48 hours prior to first CHG shower.  It is OK to shave your face.  Please follow these instructions carefully:  1.  Shower the night before surgery and the morning of surgery with CHG.  2.  If you choose to wash your hair, wash your hair first as usual with your normal shampoo.  3.  After you shampoo, rinse your hair and body thoroughly to remove the shampoo.  4.  Use CHG as you would any other liquid soap.  You can apply CHG directly to the skin and wash gently with a clean washcloth. 5.  Apply the CHG Soap to your body ONLY FROM THE NECK DOWN.  Do not use on open wounds or open sores.  Avoid contact with your eyes, ears, mouth and genitals (private parts).  Wash genitals (private parts) with your normal soap.  6.  Wash thoroughly, paying special attention to the area where your surgery will be performed.  7.  Thoroughly rinse your body with warm water from the neck down.   8.  DO NOT shower/wash with your normal soap after using and rinsing off the CHG soap.  9.  Pat yourself dry with a clean towel.           10.  Wear clean pajamas.           11.  Place clean sheets on your bed the night of your first shower and do not sleep with pets.  Day of Surgery: Do not apply any deodorants/lotions.  Please wear clean clothes to the hospital/surgery center.

## 2020-06-25 NOTE — Pre-Procedure Instructions (Signed)
Instructed patient on the following items: Arrival time 1100 Nothing to eat or drink after midnight No meds AM of procedure Responsible person to drive you home and stay with you for 24 hrs Wash with special soap night before and morning of procedure  

## 2020-06-26 ENCOUNTER — Encounter (HOSPITAL_COMMUNITY)
Admission: RE | Disposition: A | Discharge status: Home / Self Care | Payer: Self-pay | Source: Home / Self Care | Attending: Cardiology

## 2020-06-26 ENCOUNTER — Other Ambulatory Visit: Payer: Self-pay

## 2020-06-26 ENCOUNTER — Ambulatory Visit (HOSPITAL_COMMUNITY): Payer: PPO

## 2020-06-26 ENCOUNTER — Ambulatory Visit (HOSPITAL_COMMUNITY)
Admission: RE | Admit: 2020-06-26 | Discharge: 2020-06-26 | Disposition: A | Discharge status: Home / Self Care | Payer: PPO | Attending: Cardiology | Admitting: Cardiology

## 2020-06-26 DIAGNOSIS — I700 Atherosclerosis of aorta: Secondary | ICD-10-CM | POA: Diagnosis not present

## 2020-06-26 DIAGNOSIS — R001 Bradycardia, unspecified: Secondary | ICD-10-CM | POA: Diagnosis not present

## 2020-06-26 DIAGNOSIS — Z79899 Other long term (current) drug therapy: Secondary | ICD-10-CM | POA: Diagnosis not present

## 2020-06-26 DIAGNOSIS — Z45010 Encounter for checking and testing of cardiac pacemaker pulse generator [battery]: Secondary | ICD-10-CM | POA: Diagnosis not present

## 2020-06-26 DIAGNOSIS — I110 Hypertensive heart disease with heart failure: Secondary | ICD-10-CM | POA: Insufficient documentation

## 2020-06-26 DIAGNOSIS — E785 Hyperlipidemia, unspecified: Secondary | ICD-10-CM | POA: Diagnosis not present

## 2020-06-26 DIAGNOSIS — Z950 Presence of cardiac pacemaker: Secondary | ICD-10-CM | POA: Diagnosis not present

## 2020-06-26 DIAGNOSIS — I420 Dilated cardiomyopathy: Secondary | ICD-10-CM | POA: Diagnosis not present

## 2020-06-26 DIAGNOSIS — Z87891 Personal history of nicotine dependence: Secondary | ICD-10-CM | POA: Diagnosis not present

## 2020-06-26 DIAGNOSIS — Z95818 Presence of other cardiac implants and grafts: Secondary | ICD-10-CM

## 2020-06-26 DIAGNOSIS — Z8249 Family history of ischemic heart disease and other diseases of the circulatory system: Secondary | ICD-10-CM | POA: Diagnosis not present

## 2020-06-26 DIAGNOSIS — I442 Atrioventricular block, complete: Secondary | ICD-10-CM | POA: Diagnosis not present

## 2020-06-26 DIAGNOSIS — I5022 Chronic systolic (congestive) heart failure: Secondary | ICD-10-CM | POA: Diagnosis not present

## 2020-06-26 HISTORY — PX: BIV PACEMAKER INSERTION CRT-P: EP1199

## 2020-06-26 LAB — CBC
HCT: 40.3 % (ref 39.0–52.0)
Hemoglobin: 13.1 g/dL (ref 13.0–17.0)
MCH: 31 pg (ref 26.0–34.0)
MCHC: 32.5 g/dL (ref 30.0–36.0)
MCV: 95.5 fL (ref 80.0–100.0)
Platelets: 85 10*3/uL — ABNORMAL LOW (ref 150–400)
RBC: 4.22 MIL/uL (ref 4.22–5.81)
RDW: 13 % (ref 11.5–15.5)
WBC: 6.4 10*3/uL (ref 4.0–10.5)
nRBC: 0 % (ref 0.0–0.2)

## 2020-06-26 SURGERY — BIV PACEMAKER INSERTION CRT-P

## 2020-06-26 MED ORDER — SODIUM CHLORIDE 0.9 % IV SOLN
80.0000 mg | INTRAVENOUS | Status: AC
  Administered 2020-06-26: 80 mg

## 2020-06-26 MED ORDER — IOHEXOL 350 MG/ML SOLN
INTRAVENOUS | Status: DC | PRN
  Administered 2020-06-26: 5 mL
  Administered 2020-06-26: 20 mL

## 2020-06-26 MED ORDER — CEFAZOLIN SODIUM-DEXTROSE 1-4 GM/50ML-% IV SOLN
1.0000 g | Freq: Four times a day (QID) | INTRAVENOUS | Status: DC
  Administered 2020-06-26: 1 g via INTRAVENOUS
  Filled 2020-06-26: qty 50

## 2020-06-26 MED ORDER — HEPARIN (PORCINE) IN NACL 1000-0.9 UT/500ML-% IV SOLN
INTRAVENOUS | Status: DC | PRN
  Administered 2020-06-26: 500 mL

## 2020-06-26 MED ORDER — MIDAZOLAM HCL 5 MG/5ML IJ SOLN
INTRAMUSCULAR | Status: DC | PRN
  Administered 2020-06-26: 1 mg via INTRAVENOUS

## 2020-06-26 MED ORDER — HEPARIN (PORCINE) IN NACL 1000-0.9 UT/500ML-% IV SOLN
INTRAVENOUS | Status: AC
  Filled 2020-06-26: qty 500

## 2020-06-26 MED ORDER — ONDANSETRON HCL 4 MG/2ML IJ SOLN: 4.0000 mg | Freq: Four times a day (QID) | INTRAMUSCULAR | Status: DC | PRN

## 2020-06-26 MED ORDER — ACETAMINOPHEN 325 MG PO TABS
325.0000 mg | ORAL_TABLET | ORAL | Status: DC | PRN
  Filled 2020-06-26: qty 2

## 2020-06-26 MED ORDER — LIDOCAINE HCL (PF) 1 % IJ SOLN
INTRAMUSCULAR | Status: AC
  Filled 2020-06-26: qty 30

## 2020-06-26 MED ORDER — FENTANYL CITRATE (PF) 100 MCG/2ML IJ SOLN
INTRAMUSCULAR | Status: DC | PRN
  Administered 2020-06-26: 25 ug via INTRAVENOUS

## 2020-06-26 MED ORDER — CEFAZOLIN SODIUM-DEXTROSE 2-4 GM/100ML-% IV SOLN
INTRAVENOUS | Status: AC
  Filled 2020-06-26: qty 100

## 2020-06-26 MED ORDER — LIDOCAINE HCL (PF) 1 % IJ SOLN
INTRAMUSCULAR | Status: DC | PRN
  Administered 2020-06-26: 60 mL

## 2020-06-26 MED ORDER — CHLORHEXIDINE GLUCONATE 4 % EX LIQD
4.0000 "application " | Freq: Once | CUTANEOUS | Status: DC
  Filled 2020-06-26: qty 60

## 2020-06-26 MED ORDER — MIDAZOLAM HCL 5 MG/5ML IJ SOLN
INTRAMUSCULAR | Status: AC
  Filled 2020-06-26: qty 5

## 2020-06-26 MED ORDER — SODIUM CHLORIDE 0.9 % IV SOLN: INTRAVENOUS | Status: DC

## 2020-06-26 MED ORDER — FENTANYL CITRATE (PF) 100 MCG/2ML IJ SOLN
INTRAMUSCULAR | Status: AC
  Filled 2020-06-26: qty 2

## 2020-06-26 MED ORDER — CEFAZOLIN SODIUM-DEXTROSE 2-4 GM/100ML-% IV SOLN
2.0000 g | INTRAVENOUS | Status: AC
  Administered 2020-06-26: 2 g via INTRAVENOUS

## 2020-06-26 MED ORDER — SODIUM CHLORIDE 0.9 % IV SOLN
INTRAVENOUS | Status: AC
  Filled 2020-06-26: qty 2

## 2020-06-26 SURGICAL SUPPLY — 14 items
BALLN COR SINUS VENO 6F 80CM (BALLOONS) ×1
BALLN COR SINUS VENO 6FR 80 (BALLOONS) ×2
BALLOON COR SINUS VENO 6FR 80 (BALLOONS) ×1 IMPLANT
CABLE SURGICAL S-101-97-12 (CABLE) ×3 IMPLANT
CATH CPS DIRECT 135 DS2C020 (CATHETERS) ×3 IMPLANT
CPS IMPLANT KIT 410190 (MISCELLANEOUS) ×3 IMPLANT
LEAD QUARTET 1458Q-86CM (Lead) ×3 IMPLANT
PACEMAKER QUDR ALLR CRT PM3562 (Pacemaker) ×1 IMPLANT
PAD PRO RADIOLUCENT 2001M-C (PAD) ×3 IMPLANT
PMKR QUADRA ALLURE CRT PM3562 (Pacemaker) ×3 IMPLANT
SHEATH 8FR PRELUDE SNAP 13 (SHEATH) ×3 IMPLANT
TRAY PACEMAKER INSERTION (PACKS) ×3 IMPLANT
WIRE ACUITY WHISPER EDS 4648 (WIRE) ×3 IMPLANT
WIRE HI TORQ VERSACORE-J 145CM (WIRE) ×3 IMPLANT

## 2020-06-26 NOTE — Interval H&P Note (Signed)
History and Physical Interval Note:  06/26/2020 1:17 PM  Alvin Lang  has presented today for surgery, with the diagnosis of cardiomyopathy.  The various methods of treatment have been discussed with the patient and family. After consideration of risks, benefits and other options for treatment, the patient has consented to  Procedure(s): BIV PACEMAKER INSERTION CRT-P (N/A) as a surgical intervention.  The patient's history has been reviewed, patient examined, no change in status, stable for surgery.  I have reviewed the patient's chart and labs.  Questions were answered to the patient's satisfaction.     Wilber Fini [employer]

## 2020-06-26 NOTE — Discharge Instructions (Signed)
After Your Lead Revision  . Do not lift your arm above shoulder height for 1 week after your procedure. After 7 days, you may progress as below.  . You should remove your sling 24 hours after your procedure, unless otherwise instructed by your provider.     Wednesday July 03, 2020  Thursday July 04, 2020 Friday July 05, 2020 Saturday July 06, 2020   . Do not lift, push, pull, or carry anything over 10 pounds with the affected arm until 6 weeks (Wednesday August 07, 2020) after your procedure.   . Do not drive until your wound check or until instructed by your healthcare provider that you are safe to do so.   . Monitor your surgical site for redness, swelling, and drainage. Call the device clinic at [phone] if you experience these symptoms or fever/chills.   REMOVE OUTER DRESSING IN 24 HOURS, STERI STRIPS WILL FALL OFF ON THERE OWN. KEEP SITE DRY TILL WOUND CHECK.  . You may use a hot tub or a pool AFTER your wound check appointment if the incision is completely closed.   . Your cardiac device may be MRI compatible. We will discuss this at your office visit/Wound check  . Remote monitoring is used to monitor your cardiac device from home. This monitoring is scheduled every 91 days by our office. It allows [place] to keep an eye on the functioning of your device to ensure it is working properly. You will routinely see your Electrophysiologist annually (more often if necessary).   Implantable Cardiac Device Lead Replacement, Care After This sheet gives you information about how to care for yourself after your procedure. Your health care provider may also give you more specific instructions. If you have problems or questions, contact your health care provider. What can I expect after the procedure? After your procedure, it is common to have:  Mild discomfort at the incision site.  A small amount of drainage or bleeding at the incision site. This is usually no more than a spot. Follow these  instructions at home: Incision care         Follow instructions from your health care provider about how to take care of your incision. Make sure you: ? Leave stitches (sutures), skin glue, or adhesive strips in place. These skin closures may need to stay in place for 2 weeks or longer. If adhesive strip edges start to loosen and curl up, you may trim the loose edges. Do not remove adhesive strips completely unless your health care provider tells you to do that.  Check your incision area every day for signs of infection. Check for: ? More redness, swelling, or pain. ? More fluid or blood. ? Warmth. ? Pus or a bad smell. Electric and [employer] cell phones should be kept 12 inches (30 cm) away from the cardiac device when they are on. When talking on a cell phone, use the ear on the opposite side of your cardiac device.  Do not place a cell phone in a pocket next to the cardiac device.  Household appliances do not interfere with modern-day cardiac device. Medicines  Take over-the-counter and prescription medicines only as told by your health care provider. General instructions  Do not raise the arm on the side of your procedure higher than your shoulder for at least 7 days. Except for this restriction, continue to use your arm as normal to prevent problems.  Do not take baths, swim, or use a hot tub until  your health care provider says it is okay to do so. You may shower as directed by your health care provider.  Do not lift anything that is heavier than 10 lb (4.5 kg) for 6 weeks or the limit that your health care provider tells you, until he or she says that it is safe.  Return to your normal activities after 2 weeks, or as told by your health care provider. Ask your health care provider what activities are safe for you.  Keep all follow-up visits as told by your health care provider. This is important. Contact a health care provider if:  You have more redness,  swelling, or pain around your incision.  You have more fluid or blood coming from your incision.  Your incision feels warm to the touch.  You have pus or a bad smell coming from your incision.  You have a fever.  The arm or hand on the side of the cardiac device becomes swollen.  The symptoms you had before your procedure are not getting better. Get help right away if:  You develop chest pain.  You feel like you will faint.  You feel light-headed.  You faint. Summary  Check your incision area every day for signs of infection, such as more fluid or blood. A small amount of drainage or bleeding at the incision site is normal.  Do not raise the arm on the side of your procedure higher than your shoulder for at least 5 days, or as long as directed by your health care provider.  Digital cell phones should be kept 12 inches (30 cm) away from the cardiac device when they are on. When talking on a cell phone, use the ear on the opposite side of your cardiac device.  If the symptoms that led to having your lead replaced are not getting better, contact your health care provider. This information is not intended to replace advice given to you by your health care provider. Make sure you discuss any questions you have with your health care provider.

## 2020-06-27 ENCOUNTER — Encounter (HOSPITAL_COMMUNITY): Payer: Self-pay | Admitting: Cardiology

## 2020-06-28 DIAGNOSIS — Z9181 History of falling: Secondary | ICD-10-CM | POA: Diagnosis not present

## 2020-06-28 DIAGNOSIS — E785 Hyperlipidemia, unspecified: Secondary | ICD-10-CM | POA: Diagnosis not present

## 2020-06-28 DIAGNOSIS — Z0000 Encounter for general adult medical examination without abnormal findings: Secondary | ICD-10-CM | POA: Diagnosis not present

## 2020-06-28 DIAGNOSIS — Z1331 Encounter for screening for depression: Secondary | ICD-10-CM | POA: Diagnosis not present

## 2020-06-28 NOTE — Progress Notes (Signed)
Remote pacemaker transmission.   

## 2020-07-03 ENCOUNTER — Other Ambulatory Visit: Payer: Self-pay

## 2020-07-03 ENCOUNTER — Ambulatory Visit: Payer: PPO | Admitting: Cardiology

## 2020-07-03 ENCOUNTER — Encounter: Payer: Self-pay | Admitting: Cardiology

## 2020-07-03 VITALS — BP 130/80 | HR 66 | Ht 71.0 in | Wt 171.0 lb

## 2020-07-03 DIAGNOSIS — Z950 Presence of cardiac pacemaker: Secondary | ICD-10-CM | POA: Diagnosis not present

## 2020-07-03 DIAGNOSIS — I428 Other cardiomyopathies: Secondary | ICD-10-CM | POA: Diagnosis not present

## 2020-07-03 DIAGNOSIS — E782 Mixed hyperlipidemia: Secondary | ICD-10-CM | POA: Diagnosis not present

## 2020-07-03 DIAGNOSIS — I10 Essential (primary) hypertension: Secondary | ICD-10-CM

## 2020-07-03 NOTE — Patient Instructions (Signed)

## 2020-07-03 NOTE — Progress Notes (Signed)
Cardiology Office Note:    Date:  07/03/2020   ID:  Alvin Lang, DOB 05-Aug-1945, MRN [id number]  PCP:  Nicholos Johns, MD  Cardiologist:  Jenne Campus, MD    Referring MD: Nicholos Johns, MD   Chief Complaint  Patient presents with   Follow-up  Am doing well  History of Present Illness:    Alvin Lang is a 75 y.o. 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  He does have history of complete heart block and required dual-chamber pacemaker that I put in many years ago.  Recently he is at battery of the pacemaker ran out and he required pacemaker replacement he is device has been upgraded to BiV pacemaker.  He comes today 2 months of follow-up overall he is doing well denies have any chest pain tightness squeezing pressure burning chest.  He jokingly telling me that he did not have times to test his new device in terms of how much he is able to do it at but he will do this and let me know.  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   Dyslipidemia 01/31/2015   Essential hypertension 01/31/2015   Hyperlipidemia    Hypertension    Nonischemic cardiomyopathy ([place])    Pacemaker 05/19/2018   St.Jude   Pacemaker reprogramming/check 01/31/2015   Overview:  St jude device    Past Surgical History:  Procedure Laterality Date   BIV PACEMAKER INSERTION CRT-P N/A 06/26/2020   Procedure: BIV PACEMAKER INSERTION CRT-P;  Surgeon: Constance Haw, MD;  Location: [facility];  Service: Cardiovascular;  Laterality: N/A;   PACEMAKER GENERATOR CHANGE Left 12/16/2010   SJM [employer] DR RF PPM  for complete heart block,  generator change by Dr Agustin Cree   PACEMAKER IMPLANT  2002   by Dr Agustin Cree    Current Medications: Current Meds  Medication Sig   atorvastatin (LIPITOR) 20 MG tablet Take 1 tablet by mouth once daily   Calcium Carb-Cholecalciferol (CALCIUM-VITAMIN D) 500-200 MG-UNIT tablet Take 1 tablet by mouth 2 (two) times daily.   carvedilol (COREG) 25 MG tablet Take 1 tablet by mouth twice daily   ENTRESTO 49-51 MG Take 1 tablet by mouth twice daily   omega-3 fish oil (MAXEPA) 1000 MG CAPS capsule Take 2 capsules by mouth daily.   spironolactone (ALDACTONE) 25 MG tablet Take 1/2 (one-half) tablet by mouth once daily   Vitamin D, Ergocalciferol, (DRISDOL) 1.25 MG (50000 UT) CAPS capsule Take 1 capsule by mouth every 21 ( twenty-one) days.      Allergies:   Patient has no known allergies.   Social History   Socioeconomic History   Marital status: Married    Spouse name: Not on file   Number of children: Not on file   Years of education: Not on file   Highest education level: Not on file  Occupational History   Not on file  Tobacco Use   Smoking status: Former    Pack years: 0.00   Smokeless tobacco: Never  Vaping Use   Vaping Use: Never used  Substance and Sexual Activity   Alcohol use: No  Drug use: No   Sexual activity: Not on file  Other Topics Concern   Not on file  Social History Narrative   Not on file   Social Determinants of Health   Financial Resource Strain: Not on file  Food Insecurity: Not on file  Transportation Needs: Not on file  Physical Activity: Not on file  Stress: Not on file  Social Connections: Not on file     Family History: The patient's family history includes Diabetes in his father; Heart attack in his mother; Heart disease in his father. ROS:   Please see the history of present illness.    All 14 point review of systems negative except as described per history of present illness  EKGs/Labs/Other Studies Reviewed:       Recent Labs: 06/24/2020: BUN 14; Creatinine, Ser 0.76; Potassium 4.4; Sodium 139 06/26/2020: Hemoglobin 13.1; Platelets 85  Recent Lipid Panel    Component Value Date/Time   CHOL 148 11/29/2019 1700   TRIG 194 (H) 11/29/2019 1700   HDL 32 (L) 11/29/2019 1700   CHOLHDL 4.6 11/29/2019 1700   LDLCALC 83 11/29/2019 1700    Physical Exam:    VS:  BP 130/80 (BP Location: Left Arm, Patient Position: Sitting, Cuff Size: Normal)   Pulse 66   Ht 5\' 11"  (1.803 m)   Wt 171 lb (77.6 kg)   SpO2 98%   BMI 23.85 kg/m     Wt Readings from Last 3 Encounters:  07/03/20 171 lb (77.6 kg)  06/26/20 176 lb (79.8 kg)  06/24/20 176 lb (79.8 kg)     GEN:  Well nourished, well developed in no acute distress HEENT: Normal NECK: No JVD; No carotid bruits LYMPHATICS: No lymphadenopathy CARDIAC: RRR, no murmurs, no rubs, no gallops RESPIRATORY:  Clear to auscultation without rales, wheezing or rhonchi  ABDOMEN: Soft, non-tender, non-distended MUSCULOSKELETAL:  No edema; No deformity  SKIN: Warm and dry LOWER EXTREMITIES: no swelling NEUROLOGIC:  Alert and oriented x 3 PSYCHIATRIC:  Normal affect   ASSESSMENT:    1. Nonischemic cardiomyopathy ([place])   2. Essential hypertension   3. Pacemaker   4. Mixed hyperlipidemia    PLAN:    In order of problems listed above:  Nonischemic cardiomyopathy with improvement left ventricle ejection fraction based on echocardiogram from end of last year.  He is on Entresto and he is on beta-blocker he is also on Aldactone we will continue present medications.  I did review his latest lab work test which showed normal creatinine normal potassium we will continue present management. Essential hypertension his blood pressure well controlled continue present management. Pacemaker present is being upgraded to BiV pacing.  Notes by EP team reviewed. Dyslipidemia I do have his fasting lipid profile from [facility] showing LDL of 86 HDL 31 is a good profile we will  continue present management. Overall think he is doing well we will continue present management see him back in 6 months in my office   Medication Adjustments/Labs and Tests Ordered: Current medicines are reviewed at length with the patient today.  Concerns regarding medicines are outlined above.  No orders of the defined types were placed in this encounter.  Medication changes: No orders of the defined types were placed in this encounter.   Signed, Park Liter, MD, [facility] 07/03/2020 10:39 AM    [place]

## 2020-07-09 ENCOUNTER — Other Ambulatory Visit: Payer: Self-pay

## 2020-07-09 ENCOUNTER — Ambulatory Visit
Admission: RE | Admit: 2020-07-09 | Discharge: 2020-07-09 | Disposition: A | Discharge status: Home / Self Care | Payer: PPO | Source: Ambulatory Visit | Attending: Cardiology | Admitting: Cardiology

## 2020-07-09 ENCOUNTER — Ambulatory Visit (INDEPENDENT_AMBULATORY_CARE_PROVIDER_SITE_OTHER): Payer: PPO

## 2020-07-09 DIAGNOSIS — I442 Atrioventricular block, complete: Secondary | ICD-10-CM

## 2020-07-09 DIAGNOSIS — Z950 Presence of cardiac pacemaker: Secondary | ICD-10-CM

## 2020-07-09 LAB — CUP PACEART INCLINIC DEVICE CHECK
Battery Remaining Longevity: 98 mo
Battery Voltage: 2.99 V
Brady Statistic RA Percent Paced: 97 %
Brady Statistic RV Percent Paced: 99.9 %
Date Time Interrogation Session: 20220621101648
Implantable Lead Implant Date: 20020201
Implantable Lead Implant Date: 20020201
Implantable Lead Implant Date: 20220608
Implantable Lead Location: 753858
Implantable Lead Location: 753859
Implantable Lead Location: 753860
Implantable Pulse Generator Implant Date: 20220608
Lead Channel Impedance Value: 237.5 Ohm
Lead Channel Impedance Value: 312.5 Ohm
Lead Channel Impedance Value: 675 Ohm
Lead Channel Pacing Threshold Amplitude: 0.5 V
Lead Channel Pacing Threshold Amplitude: 1 V
Lead Channel Pacing Threshold Amplitude: 1 V
Lead Channel Pacing Threshold Amplitude: 3 V
Lead Channel Pacing Threshold Amplitude: 3 V
Lead Channel Pacing Threshold Pulse Width: 0.4 ms
Lead Channel Pacing Threshold Pulse Width: 0.4 ms
Lead Channel Pacing Threshold Pulse Width: 0.5 ms
Lead Channel Pacing Threshold Pulse Width: 1 ms
Lead Channel Pacing Threshold Pulse Width: 1 ms
Lead Channel Sensing Intrinsic Amplitude: 2 mV
Lead Channel Setting Pacing Amplitude: 2 V
Lead Channel Setting Pacing Amplitude: 2 V
Lead Channel Setting Pacing Pulse Width: 0.5 ms
Lead Channel Setting Sensing Sensitivity: 4 mV
Pulse Gen Model: 3562
Pulse Gen Serial Number: 3912796

## 2020-07-09 NOTE — Patient Instructions (Signed)
Please obtain Chest Xray at [place].  1st floor

## 2020-07-09 NOTE — Progress Notes (Signed)
Wound check appointment. Steri-strips removed. Wound without redness or edema. Incision edges approximated, wound well healed. Normal device function. RA/RV Thresholds, P wave sensing, and impedances consistent with implant measurements.  LV thresholds noted elevated compared to implant.  multiple vectors tested all M3 lead measurements >5v@1ms .  M1-M2 tested 3.0v@1ms .  Dr. Curt Bears notified, chest xray ordered.  Per MD LV lead programmed off.   Device RA/RV leads programmed at  appropriately for chronic leads. Histogram distribution appropriate for patient and level of activity. No mode switches or high ventricular rates noted. Patient educated about wound care, arm mobility, lifting restrictions. Patient is enrolled in remote monitoring, next scheduled check 09/25/20.  ROV with Dr. Curt Bears on 09/30/20.

## 2020-07-12 ENCOUNTER — Telehealth: Payer: Self-pay

## 2020-07-12 NOTE — Telephone Encounter (Signed)
Successful telephone encounter to [place]. Meiklejohn to schedule a follow up appointment in device clinic for Bi-V optimization per Dr. Artist Beach. [place], PA request.  Patient agreeable to appointment and is scheduled for Tuesday, 07/16/20 at 8:40 am. Industry aware.

## 2020-07-16 ENCOUNTER — Other Ambulatory Visit: Payer: Self-pay

## 2020-07-16 ENCOUNTER — Ambulatory Visit (INDEPENDENT_AMBULATORY_CARE_PROVIDER_SITE_OTHER): Payer: PPO

## 2020-07-16 DIAGNOSIS — I428 Other cardiomyopathies: Secondary | ICD-10-CM | POA: Diagnosis not present

## 2020-07-16 LAB — CUP PACEART INCLINIC DEVICE CHECK
Date Time Interrogation Session: 20220628091206
Implantable Lead Implant Date: 20020201
Implantable Lead Implant Date: 20020201
Implantable Lead Implant Date: 20220608
Implantable Lead Location: 753858
Implantable Lead Location: 753859
Implantable Lead Location: 753860
Implantable Pulse Generator Implant Date: 20220608
Pulse Gen Model: 3562
Pulse Gen Serial Number: 3912796

## 2020-07-16 NOTE — Progress Notes (Signed)
Patient seen in device clinic today to turn on LV lead and Optimization. See scanned report.  Changes below made to session: Reviewed by Dr. Curt Bears  -Ventricular Pacing Chamber --> LV + RV (Simultaneous) - LV pulse configuration --> Distal tip - Mid 2 - LV Pulse Width --> 0.8 ms

## 2020-07-24 DIAGNOSIS — H353131 Nonexudative age-related macular degeneration, bilateral, early dry stage: Secondary | ICD-10-CM | POA: Diagnosis not present

## 2020-08-01 NOTE — Addendum Note (Signed)
Addended by: Roma Kayser T on: 08/01/2020 01:59 PM   Modules accepted: Orders

## 2020-09-12 DIAGNOSIS — E559 Vitamin D deficiency, unspecified: Secondary | ICD-10-CM | POA: Diagnosis not present

## 2020-09-12 DIAGNOSIS — E785 Hyperlipidemia, unspecified: Secondary | ICD-10-CM | POA: Diagnosis not present

## 2020-09-12 DIAGNOSIS — M1990 Unspecified osteoarthritis, unspecified site: Secondary | ICD-10-CM | POA: Diagnosis not present

## 2020-09-12 DIAGNOSIS — I10 Essential (primary) hypertension: Secondary | ICD-10-CM | POA: Diagnosis not present

## 2020-09-12 DIAGNOSIS — E663 Overweight: Secondary | ICD-10-CM | POA: Diagnosis not present

## 2020-09-12 DIAGNOSIS — Z8679 Personal history of other diseases of the circulatory system: Secondary | ICD-10-CM | POA: Diagnosis not present

## 2020-09-25 ENCOUNTER — Ambulatory Visit (INDEPENDENT_AMBULATORY_CARE_PROVIDER_SITE_OTHER): Payer: PPO

## 2020-09-25 DIAGNOSIS — I428 Other cardiomyopathies: Secondary | ICD-10-CM | POA: Diagnosis not present

## 2020-09-25 LAB — CUP PACEART REMOTE DEVICE CHECK
Battery Remaining Longevity: 69 mo
Battery Remaining Percentage: 95.5 %
Battery Voltage: 2.99 V
Brady Statistic AP VP Percent: 96 %
Brady Statistic AP VS Percent: 1 %
Brady Statistic AS VP Percent: 3.4 %
Brady Statistic AS VS Percent: 1 %
Brady Statistic RA Percent Paced: 96 %
Date Time Interrogation Session: 20220907044252
Implantable Lead Implant Date: 20020201
Implantable Lead Implant Date: 20020201
Implantable Lead Implant Date: 20220608
Implantable Lead Location: 753858
Implantable Lead Location: 753859
Implantable Lead Location: 753860
Implantable Pulse Generator Implant Date: 20220608
Lead Channel Impedance Value: 250 Ohm
Lead Channel Impedance Value: 790 Ohm
Lead Channel Impedance Value: 940 Ohm
Lead Channel Pacing Threshold Amplitude: 0.5 V
Lead Channel Pacing Threshold Amplitude: 1 V
Lead Channel Pacing Threshold Amplitude: 2.625 V
Lead Channel Pacing Threshold Pulse Width: 0.4 ms
Lead Channel Pacing Threshold Pulse Width: 0.5 ms
Lead Channel Pacing Threshold Pulse Width: 0.8 ms
Lead Channel Sensing Intrinsic Amplitude: 1.8 mV
Lead Channel Setting Pacing Amplitude: 2 V
Lead Channel Setting Pacing Amplitude: 2 V
Lead Channel Setting Pacing Amplitude: 3.625
Lead Channel Setting Pacing Pulse Width: 0.5 ms
Lead Channel Setting Pacing Pulse Width: 0.8 ms
Lead Channel Setting Sensing Sensitivity: 4 mV
Pulse Gen Model: 3562
Pulse Gen Serial Number: 3912796

## 2020-09-30 ENCOUNTER — Encounter: Payer: Self-pay | Admitting: Cardiology

## 2020-09-30 ENCOUNTER — Ambulatory Visit (INDEPENDENT_AMBULATORY_CARE_PROVIDER_SITE_OTHER): Payer: PPO | Admitting: Cardiology

## 2020-09-30 ENCOUNTER — Other Ambulatory Visit: Payer: Self-pay

## 2020-09-30 VITALS — BP 136/70 | HR 64 | Ht 71.0 in | Wt 177.2 lb

## 2020-09-30 DIAGNOSIS — I442 Atrioventricular block, complete: Secondary | ICD-10-CM | POA: Diagnosis not present

## 2020-09-30 NOTE — Progress Notes (Signed)
Electrophysiology Office Note   Date:  09/30/2020   ID:  Alvin Lang, DOB 11-24-1945, MRN [id number]  PCP:  Nicholos Johns, MD  Cardiologist:  Agustin Cree Primary Electrophysiologist:  Curley Hogen Meredith Leeds, MD    Chief Complaint: pacemaker   History of Present Illness: Alvin Lang is a 75 y.o. male who is being seen today for the evaluation of pacemaker at the request of Nicholos Johns, MD. Presenting today for electrophysiology evaluation.  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  He is status post [employer] Jude dual-chamber pacemaker implanted for complete heart block.  As his ejection fraction went down, he is now status post CRT-P upgrade on 06/26/2020.  Today, denies symptoms of palpitations, chest pain, shortness of breath, orthopnea, PND, lower extremity edema, claudication, dizziness, presyncope, syncope, bleeding, or neurologic sequela. The patient is tolerating medications without difficulties.  Since his CRT-P upgrade he has done well.  He has no chest pain or shortness of breath.  Is able do all of his daily activities without restriction.  He states that he feels may be a little less fatigue since the implanted, otherwise has no major complaints.   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   Dyslipidemia 01/31/2015   Essential hypertension 01/31/2015   Hyperlipidemia    Hypertension    Nonischemic cardiomyopathy ([place])    Pacemaker 05/19/2018   St.Jude   Pacemaker reprogramming/check 01/31/2015   Overview:  St jude device   Past Surgical History:  Procedure Laterality Date   BIV PACEMAKER INSERTION CRT-P N/A 06/26/2020   Procedure: BIV PACEMAKER INSERTION CRT-P;  Surgeon: Constance Haw, MD;  Location: [facility];  Service: Cardiovascular;   Laterality: N/A;   PACEMAKER GENERATOR CHANGE Left 12/16/2010   SJM Acc[employer] DR RF PPM for complete heart block,  generator change by Dr Agustin Cree   PACEMAKER IMPLANT  2002   by Dr Agustin Cree     Current Outpatient Medications  Medication Sig Dispense Refill   atorvastatin (LIPITOR) 20 MG tablet Take 1 tablet by mouth once daily 90 tablet 2   Calcium Carb-Cholecalciferol (CALCIUM-VITAMIN D) 500-200 MG-UNIT tablet Take 1 tablet by mouth 2 (two) times daily.     carvedilol (COREG) 25 MG tablet Take 1 tablet by mouth twice daily 180 tablet 1   ENTRESTO 49-51 MG Take 1 tablet by mouth twice daily 180 tablet 2   omega-3 fish oil (MAXEPA) 1000 MG CAPS capsule Take 2 capsules by mouth daily.     spironolactone (ALDACTONE) 25 MG tablet Take 1/2 (one-half) tablet by mouth once daily 45 tablet 1   Vitamin D, Ergocalciferol, (DRISDOL) 1.25 MG (50000 UT) CAPS capsule Take 1 capsule by mouth every 21 ( twenty-one) days.      No current facility-administered medications for this visit.    Allergies:   Patient has no known allergies.   Social History:  The patient  reports that he has quit smoking. He has never used smokeless tobacco. He reports that he does not drink alcohol and does not use drugs.   Family History:  The patient's family history includes Diabetes in his father; Heart attack in his mother; Heart disease in his father.   ROS:  Please see the history of present illness.   Otherwise, review of systems is positive for none.  All other systems are reviewed and negative.   PHYSICAL EXAM: VS:  BP 136/70   Pulse 64   Ht '5\' 11"'$  (1.803 m)   Wt 177 lb 3.2 oz (80.4 kg)   SpO2 97%   BMI 24.71 kg/m  , BMI Body mass index is 24.71 kg/m. GEN: Well nourished, well developed, in no acute distress  HEENT: normal  Neck: no JVD, carotid bruits, or masses Cardiac: RRR; no murmurs, rubs, or gallops,no edema  Respiratory:  clear to auscultation bilaterally, normal work of breathing GI: soft,  nontender, nondistended, + BS MS: no deformity or atrophy  Skin: warm and dry Neuro:  Strength and sensation are intact Psych: euthymic mood, full affect  EKG:  EKG is ordered today. Personal review of the ekg ordered shows AV paced   Recent Labs: 06/24/2020: BUN 14; Creatinine, Ser 0.76; Potassium 4.4; Sodium 139 06/26/2020: Hemoglobin 13.1; Platelets 85    Lipid Panel     Component Value Date/Time   CHOL 148 11/29/2019 1700   TRIG 194 (H) 11/29/2019 1700   HDL 32 (L) 11/29/2019 1700   CHOLHDL 4.6 11/29/2019 1700   LDLCALC 83 11/29/2019 1700     Wt Readings from Last 3 Encounters:  09/30/20 177 lb 3.2 oz (80.4 kg)  07/03/20 171 lb (77.6 kg)  06/26/20 176 lb (79.8 kg)      Other studies Reviewed: Additional studies/ records that were reviewed today include: TTE 12/28/2019 Review of the above records today demonstrates:   1. Left ventricular ejection fraction, by estimation, is 45 to 50%. The  left ventricle has mildly decreased function. The left ventricle  demonstrates global hypokinesis. There is moderate concentric left  ventricular hypertrophy. Left ventricular  diastolic parameters are consistent with Grade I diastolic dysfunction  (impaired relaxation).   2. Right ventricular systolic function is normal. The right ventricular  size is normal. There is normal pulmonary artery systolic pressure.   3. Left atrial size was mildly dilated.   4. The mitral valve is normal in structure. Mild mitral valve  regurgitation. No evidence of mitral stenosis.   5. The aortic valve is normal in structure. Aortic valve regurgitation is  not visualized. Mild aortic valve sclerosis is present, with no evidence  of aortic valve stenosis.   6. The inferior vena cava is normal in size with greater than 50%  respiratory variability, suggesting right atrial pressure of 3 mmHg.    ASSESSMENT AND PLAN:  1.  Complete heart block: Status post [facility] pacemaker with upgrade to  biventricular device 06/26/2020.  Device functioning appropriately.  We Caylynn Minchew continue with current management.  2.  Nonischemic cardiomyopathy: Currently on carvedilol 25 mg twice daily, Entresto 49/51 mg twice daily, Aldactone 12.5 mg daily.  No obvious volume overload.  No changes at this time.  3.  Hypertension: Currently well controlled  Current medicines are reviewed at length with the patient today.   The patient does not have concerns regarding his medicines.  The following changes were made today: None  Labs/ tests ordered today include:  Orders Placed This Encounter  Procedures   EKG 12-Lead      Disposition:   FU with Kenasia Scheller 9 months  Signed, Viriginia Amendola Meredith Leeds, MD  09/30/2020 2:12 PM     [facility] [address] [place] [address] [phone] (office) [phone] (fax)

## 2020-10-03 NOTE — Progress Notes (Signed)
Remote pacemaker transmission.   

## 2020-11-17 ENCOUNTER — Other Ambulatory Visit: Payer: Self-pay | Admitting: Cardiology

## 2020-12-02 ENCOUNTER — Other Ambulatory Visit: Payer: Self-pay | Admitting: Cardiology

## 2020-12-17 DIAGNOSIS — Z79899 Other long term (current) drug therapy: Secondary | ICD-10-CM | POA: Diagnosis not present

## 2020-12-17 DIAGNOSIS — E785 Hyperlipidemia, unspecified: Secondary | ICD-10-CM | POA: Diagnosis not present

## 2020-12-17 DIAGNOSIS — Z23 Encounter for immunization: Secondary | ICD-10-CM | POA: Diagnosis not present

## 2020-12-17 DIAGNOSIS — Z8679 Personal history of other diseases of the circulatory system: Secondary | ICD-10-CM | POA: Diagnosis not present

## 2020-12-17 DIAGNOSIS — I10 Essential (primary) hypertension: Secondary | ICD-10-CM | POA: Diagnosis not present

## 2020-12-17 DIAGNOSIS — R300 Dysuria: Secondary | ICD-10-CM | POA: Diagnosis not present

## 2020-12-17 DIAGNOSIS — E559 Vitamin D deficiency, unspecified: Secondary | ICD-10-CM | POA: Diagnosis not present

## 2020-12-17 DIAGNOSIS — Z139 Encounter for screening, unspecified: Secondary | ICD-10-CM | POA: Diagnosis not present

## 2020-12-17 DIAGNOSIS — M1990 Unspecified osteoarthritis, unspecified site: Secondary | ICD-10-CM | POA: Diagnosis not present

## 2020-12-17 DIAGNOSIS — E663 Overweight: Secondary | ICD-10-CM | POA: Diagnosis not present

## 2020-12-25 ENCOUNTER — Ambulatory Visit (INDEPENDENT_AMBULATORY_CARE_PROVIDER_SITE_OTHER): Payer: PPO

## 2020-12-25 DIAGNOSIS — I442 Atrioventricular block, complete: Secondary | ICD-10-CM

## 2020-12-25 LAB — CUP PACEART REMOTE DEVICE CHECK
Battery Remaining Longevity: 67 mo
Battery Remaining Percentage: 93 %
Battery Voltage: 2.99 V
Brady Statistic AP VP Percent: 95 %
Brady Statistic AP VS Percent: 1 %
Brady Statistic AS VP Percent: 4.4 %
Brady Statistic AS VS Percent: 1 %
Brady Statistic RA Percent Paced: 95 %
Date Time Interrogation Session: 20221207042253
Implantable Lead Implant Date: 20020201
Implantable Lead Implant Date: 20020201
Implantable Lead Implant Date: 20220608
Implantable Lead Location: 753858
Implantable Lead Location: 753859
Implantable Lead Location: 753860
Implantable Pulse Generator Implant Date: 20220608
Lead Channel Impedance Value: 1050 Ohm
Lead Channel Impedance Value: 230 Ohm
Lead Channel Impedance Value: 580 Ohm
Lead Channel Pacing Threshold Amplitude: 0.625 V
Lead Channel Pacing Threshold Amplitude: 1 V
Lead Channel Pacing Threshold Amplitude: 1.625 V
Lead Channel Pacing Threshold Pulse Width: 0.4 ms
Lead Channel Pacing Threshold Pulse Width: 0.5 ms
Lead Channel Pacing Threshold Pulse Width: 0.8 ms
Lead Channel Sensing Intrinsic Amplitude: 1.7 mV
Lead Channel Setting Pacing Amplitude: 2 V
Lead Channel Setting Pacing Amplitude: 2 V
Lead Channel Setting Pacing Amplitude: 2.625
Lead Channel Setting Pacing Pulse Width: 0.5 ms
Lead Channel Setting Pacing Pulse Width: 0.8 ms
Lead Channel Setting Sensing Sensitivity: 4 mV
Pulse Gen Model: 3562
Pulse Gen Serial Number: 3912796

## 2021-01-03 NOTE — Progress Notes (Signed)
Remote pacemaker transmission.   

## 2021-01-30 ENCOUNTER — Ambulatory Visit: Payer: PPO | Admitting: Cardiology

## 2021-01-30 ENCOUNTER — Encounter: Payer: Self-pay | Admitting: Cardiology

## 2021-01-30 ENCOUNTER — Other Ambulatory Visit: Payer: Self-pay

## 2021-01-30 VITALS — BP 158/82 | HR 60 | Ht 71.0 in | Wt 178.8 lb

## 2021-01-30 DIAGNOSIS — I10 Essential (primary) hypertension: Secondary | ICD-10-CM | POA: Diagnosis not present

## 2021-01-30 DIAGNOSIS — I420 Dilated cardiomyopathy: Secondary | ICD-10-CM

## 2021-01-30 DIAGNOSIS — Z950 Presence of cardiac pacemaker: Secondary | ICD-10-CM | POA: Diagnosis not present

## 2021-01-30 DIAGNOSIS — I442 Atrioventricular block, complete: Secondary | ICD-10-CM | POA: Diagnosis not present

## 2021-01-30 NOTE — Addendum Note (Signed)
Addended by: Carylon Perches on: 01/30/2021 02:41 PM   Modules accepted: Orders

## 2021-01-30 NOTE — Progress Notes (Signed)
Cardiology Office Note:    Date:  01/30/2021   ID:  Alvin Lang, DOB July 20, 1945, MRN [id number]  PCP:  Nicholos Johns, MD  Cardiologist:  Jenne Campus, MD    Referring MD: Nicholos Johns, MD   No chief complaint on file. Doing fine  History of Present Illness:    Alvin Lang is a 76 y.o. male  Alvin Lang is a 76 y.o. 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  He does have history of complete heart block and required dual-chamber pacemaker that I put in many years ago.  Recently he is at battery of the pacemaker ran out and he required pacemaker replacement he is device has been upgraded to BiV pacemaker.  Today he said he is doing great he said he is able to walk around the hospital he wants to do he chop wood and have no difficulty doing it denies have any chest pain tightness squeezing pressure burning chest.  Overall doing great  Past Medical History:  Diagnosis Date   Chronic systolic dysfunction of left ventricle    Complete heart block ([place])    Dilated cardiomyopathy ([place]) 03/26/2016   Dyslipidemia 01/31/2015   Essential hypertension 01/31/2015   Hyperlipidemia    Hypertension    Nonischemic cardiomyopathy ([place])    Pacemaker 05/19/2018   St.Jude   Pacemaker reprogramming/check 01/31/2015   Overview:  St jude device    Past Surgical History:  Procedure Laterality Date   BIV PACEMAKER INSERTION CRT-P N/A 06/26/2020   Procedure: BIV PACEMAKER INSERTION CRT-P;  Surgeon: Constance Haw, MD;  Location: [facility];  Service: Cardiovascular;  Laterality: N/A;   PACEMAKER GENERATOR CHANGE Left 12/16/2010   SJM Accent DR RF PPM for complete heart block,  generator change by Dr Agustin Cree   PACEMAKER IMPLANT  2002   by Dr Agustin Cree    Current Medications: Current Meds   Medication Sig   atorvastatin (LIPITOR) 20 MG tablet Take 1 tablet by mouth once daily   Calcium Carb-Cholecalciferol (CALCIUM-VITAMIN D) 500-200 MG-UNIT tablet Take 1 tablet by mouth 2 (two) times daily.   carvedilol (COREG) 25 MG tablet Take 1 tablet by mouth twice daily   ENTRESTO 49-51 MG Take 1 tablet by mouth twice daily   Multiple Vitamins-Minerals (PRESERVISION AREDS PO) Take 1 tablet by mouth daily.   omega-3 fish oil (MAXEPA) 1000 MG CAPS capsule Take 2 capsules by mouth daily.   spironolactone (ALDACTONE) 25 MG tablet Take 1/2 (one-half) tablet by mouth once daily   Vitamin D, Ergocalciferol, (DRISDOL) 1.25 MG (50000 UT) CAPS capsule Take 1 capsule by mouth every 21 ( twenty-one) days.      Allergies:   Patient has no known allergies.   Social History   Socioeconomic History   Marital status: Married    Spouse name: Not on file   Number of children: Not on file   Years of education: Not on file   Highest education level: Not on file  Occupational History   Not on file  Tobacco Use   Smoking status: Former   Smokeless tobacco: Never  Vaping Use   Vaping Use: Never used  Substance and Sexual Activity   Alcohol use: No   Drug use: No   Sexual activity: Not on file  Other Topics Concern   Not on file  Social History  Narrative   Not on file   Social Determinants of Health   Financial Resource Strain: Not on file  Food Insecurity: Not on file  Transportation Needs: Not on file  Physical Activity: Not on file  Stress: Not on file  Social Connections: Not on file     Family History: The patient's family history includes Diabetes in his father; Heart attack in his mother; Heart disease in his father. ROS:   Please see the history of present illness.    All 14 point review of systems negative except as described per history of present illness  EKGs/Labs/Other Studies Reviewed:      Recent Labs: 06/24/2020: BUN 14; Creatinine, Ser 0.76; Potassium 4.4; Sodium  139 06/26/2020: Hemoglobin 13.1; Platelets 85  Recent Lipid Panel    Component Value Date/Time   CHOL 148 11/29/2019 1700   TRIG 194 (H) 11/29/2019 1700   HDL 32 (L) 11/29/2019 1700   CHOLHDL 4.6 11/29/2019 1700   LDLCALC 83 11/29/2019 1700    Physical Exam:    VS:  BP (!) 158/82    Pulse 60    Ht 5\' 11"  (1.803 m)    Wt 178 lb 12.8 oz (81.1 kg)    SpO2 93%    BMI 24.94 kg/m     Wt Readings from Last 3 Encounters:  01/30/21 178 lb 12.8 oz (81.1 kg)  09/30/20 177 lb 3.2 oz (80.4 kg)  07/03/20 171 lb (77.6 kg)     GEN:  Well nourished, well developed in no acute distress HEENT: Normal NECK: No JVD; No carotid bruits LYMPHATICS: No lymphadenopathy CARDIAC: RRR, no murmurs, no rubs, no gallops RESPIRATORY:  Clear to auscultation without rales, wheezing or rhonchi  ABDOMEN: Soft, non-tender, non-distended MUSCULOSKELETAL:  No edema; No deformity  SKIN: Warm and dry LOWER EXTREMITIES: no swelling NEUROLOGIC:  Alert and oriented x 3 PSYCHIATRIC:  Normal affect   ASSESSMENT:    1. Complete heart block ([place])   2. Dilated cardiomyopathy ([place])   3. Essential hypertension   4. Pacemaker    PLAN:    In order of problems listed above:  Complete heart block did not require pacemaker.  Last year this device has been upgraded to dual-chamber that resulted in improvement left ventricle ejection fraction.  Device functioning properly this is an Abbott device and I did review interrogation for this device. Essential hypertension blood pressure elevated today however he is at every single time he comes to doctor it is up when checking at home is usually 120/70. Dyslipidemia I did review his K PN which show me his LDL of 80 and HDL of 25 we will continue moderate intensity statin he takes Lipitor 20 Pacemaker present interrogation reviewed normal function. I will ask him to have echocardiogram repeated he does have history of cardiomyopathy with normalization however I would like to check  his left ventricle ejection fraction.   Medication Adjustments/Labs and Tests Ordered: Current medicines are reviewed at length with the patient today.  Concerns regarding medicines are outlined above.  No orders of the defined types were placed in this encounter.  Medication changes: No orders of the defined types were placed in this encounter.   Signed, Park Liter, MD, [facility] 01/30/2021 2:35 PM    [facility]

## 2021-01-30 NOTE — Patient Instructions (Signed)
Medication Instructions:  Your physician recommends that you continue on your current medications as directed. Please refer to the Current Medication list given to you today.  *If you need a refill on your cardiac medications before your next appointment, please call your pharmacy*   Lab Work: None If you have labs (blood work) drawn today and your tests are completely normal, you will receive your results only by: [place] (if you have MyChart) OR A paper copy in the mail If you have any lab test that is abnormal or we need to change your treatment, we will call you to review the results.   Testing/Procedures: Your physician has requested that you have an echocardiogram. Echocardiography is a painless test that uses sound waves to create images of your heart. It provides your doctor with information about the size and shape of your heart and how well your hearts chambers and valves are working. This procedure takes approximately one hour. There are no restrictions for this procedure.   Follow-Up: At [facility], you and your health needs are our priority.  As part of our continuing mission to provide you with exceptional heart care, we have created designated Provider Care Teams.  These Care Teams include your primary Cardiologist (physician) and Advanced Practice Providers (APPs -  Physician Assistants and Nurse Practitioners) who all work together to provide you with the care you need, when you need it.  We recommend signing up for the patient portal called "MyChart".  Sign up information is provided on this After Visit Summary.  MyChart is used to connect with patients for Virtual Visits (Telemedicine).  Patients are able to view lab/test results, encounter notes, upcoming appointments, etc.  Non-urgent messages can be sent to your provider as well.   To learn more about what you can do with MyChart, go to [email or web address].    Your next appointment:   6 month(s)  The  format for your next appointment:   In Person  Provider:   Jenne Campus, MD

## 2021-02-06 ENCOUNTER — Other Ambulatory Visit: Payer: PPO

## 2021-02-12 ENCOUNTER — Ambulatory Visit (INDEPENDENT_AMBULATORY_CARE_PROVIDER_SITE_OTHER): Payer: PPO

## 2021-02-12 ENCOUNTER — Other Ambulatory Visit: Payer: Self-pay

## 2021-02-12 DIAGNOSIS — I420 Dilated cardiomyopathy: Secondary | ICD-10-CM | POA: Diagnosis not present

## 2021-02-12 DIAGNOSIS — I442 Atrioventricular block, complete: Secondary | ICD-10-CM

## 2021-02-12 LAB — ECHOCARDIOGRAM COMPLETE
Area-P 1/2: 2.46 cm2
MV M vel: 2.5 m/s
MV Peak grad: 25 mmHg
S' Lateral: 4.1 cm

## 2021-02-18 ENCOUNTER — Telehealth: Payer: Self-pay

## 2021-02-18 NOTE — Telephone Encounter (Signed)
Patient notified of results.

## 2021-02-18 NOTE — Telephone Encounter (Signed)
-----   Message from Park Liter, MD sent at 02/14/2021 10:31 AM EST ----- Echocardiogram showed preserved left ventricle ejection fraction, overall looks.

## 2021-03-25 DIAGNOSIS — R300 Dysuria: Secondary | ICD-10-CM | POA: Diagnosis not present

## 2021-03-25 DIAGNOSIS — E559 Vitamin D deficiency, unspecified: Secondary | ICD-10-CM | POA: Diagnosis not present

## 2021-03-25 DIAGNOSIS — M1990 Unspecified osteoarthritis, unspecified site: Secondary | ICD-10-CM | POA: Diagnosis not present

## 2021-03-25 DIAGNOSIS — E785 Hyperlipidemia, unspecified: Secondary | ICD-10-CM | POA: Diagnosis not present

## 2021-03-25 DIAGNOSIS — I10 Essential (primary) hypertension: Secondary | ICD-10-CM | POA: Diagnosis not present

## 2021-03-25 DIAGNOSIS — Z8679 Personal history of other diseases of the circulatory system: Secondary | ICD-10-CM | POA: Diagnosis not present

## 2021-03-25 DIAGNOSIS — D696 Thrombocytopenia, unspecified: Secondary | ICD-10-CM | POA: Diagnosis not present

## 2021-03-25 DIAGNOSIS — F4321 Adjustment disorder with depressed mood: Secondary | ICD-10-CM | POA: Diagnosis not present

## 2021-03-25 DIAGNOSIS — Z6826 Body mass index (BMI) 26.0-26.9, adult: Secondary | ICD-10-CM | POA: Diagnosis not present

## 2021-03-26 ENCOUNTER — Ambulatory Visit (INDEPENDENT_AMBULATORY_CARE_PROVIDER_SITE_OTHER): Payer: PPO

## 2021-03-26 DIAGNOSIS — I442 Atrioventricular block, complete: Secondary | ICD-10-CM

## 2021-03-26 LAB — CUP PACEART REMOTE DEVICE CHECK
Battery Remaining Longevity: 67 mo
Battery Remaining Percentage: 89 %
Battery Voltage: 2.99 V
Brady Statistic AP VP Percent: 94 %
Brady Statistic AP VS Percent: 1 %
Brady Statistic AS VP Percent: 5.2 %
Brady Statistic AS VS Percent: 1 %
Brady Statistic RA Percent Paced: 94 %
Date Time Interrogation Session: 20230308050034
Implantable Lead Implant Date: 20020201
Implantable Lead Implant Date: 20020201
Implantable Lead Implant Date: 20220608
Implantable Lead Location: 753858
Implantable Lead Location: 753859
Implantable Lead Location: 753860
Implantable Pulse Generator Implant Date: 20220608
Lead Channel Impedance Value: 1100 Ohm
Lead Channel Impedance Value: 250 Ohm
Lead Channel Impedance Value: 760 Ohm
Lead Channel Pacing Threshold Amplitude: 0.5 V
Lead Channel Pacing Threshold Amplitude: 1 V
Lead Channel Pacing Threshold Amplitude: 2 V
Lead Channel Pacing Threshold Pulse Width: 0.4 ms
Lead Channel Pacing Threshold Pulse Width: 0.5 ms
Lead Channel Pacing Threshold Pulse Width: 0.8 ms
Lead Channel Sensing Intrinsic Amplitude: 2.2 mV
Lead Channel Setting Pacing Amplitude: 2 V
Lead Channel Setting Pacing Amplitude: 2 V
Lead Channel Setting Pacing Amplitude: 3 V
Lead Channel Setting Pacing Pulse Width: 0.5 ms
Lead Channel Setting Pacing Pulse Width: 0.8 ms
Lead Channel Setting Sensing Sensitivity: 4 mV
Pulse Gen Model: 3562
Pulse Gen Serial Number: 3912796

## 2021-04-08 NOTE — Progress Notes (Signed)
Remote pacemaker transmission.   

## 2021-06-01 ENCOUNTER — Other Ambulatory Visit: Payer: Self-pay | Admitting: Cardiology

## 2021-06-25 ENCOUNTER — Ambulatory Visit (INDEPENDENT_AMBULATORY_CARE_PROVIDER_SITE_OTHER): Payer: PPO

## 2021-06-25 DIAGNOSIS — I442 Atrioventricular block, complete: Secondary | ICD-10-CM

## 2021-06-27 LAB — CUP PACEART REMOTE DEVICE CHECK
Battery Remaining Longevity: 61 mo
Battery Remaining Percentage: 85 %
Battery Voltage: 2.99 V
Brady Statistic AP VP Percent: 94 %
Brady Statistic AP VS Percent: 1 %
Brady Statistic AS VP Percent: 5.6 %
Brady Statistic AS VS Percent: 1 %
Brady Statistic RA Percent Paced: 94 %
Date Time Interrogation Session: 20230607044544
Implantable Lead Implant Date: 20020201
Implantable Lead Implant Date: 20020201
Implantable Lead Implant Date: 20220608
Implantable Lead Location: 753858
Implantable Lead Location: 753859
Implantable Lead Location: 753860
Implantable Pulse Generator Implant Date: 20220608
Lead Channel Impedance Value: 1075 Ohm
Lead Channel Impedance Value: 230 Ohm
Lead Channel Impedance Value: 580 Ohm
Lead Channel Pacing Threshold Amplitude: 0.5 V
Lead Channel Pacing Threshold Amplitude: 1 V
Lead Channel Pacing Threshold Amplitude: 2.25 V
Lead Channel Pacing Threshold Pulse Width: 0.4 ms
Lead Channel Pacing Threshold Pulse Width: 0.5 ms
Lead Channel Pacing Threshold Pulse Width: 0.8 ms
Lead Channel Sensing Intrinsic Amplitude: 2 mV
Lead Channel Setting Pacing Amplitude: 2 V
Lead Channel Setting Pacing Amplitude: 2 V
Lead Channel Setting Pacing Amplitude: 3.25 V
Lead Channel Setting Pacing Pulse Width: 0.5 ms
Lead Channel Setting Pacing Pulse Width: 0.8 ms
Lead Channel Setting Sensing Sensitivity: 4 mV
Pulse Gen Model: 3562
Pulse Gen Serial Number: 3912796

## 2021-07-01 DIAGNOSIS — Z9181 History of falling: Secondary | ICD-10-CM | POA: Diagnosis not present

## 2021-07-01 DIAGNOSIS — Z6826 Body mass index (BMI) 26.0-26.9, adult: Secondary | ICD-10-CM | POA: Diagnosis not present

## 2021-07-01 DIAGNOSIS — Z950 Presence of cardiac pacemaker: Secondary | ICD-10-CM | POA: Diagnosis not present

## 2021-07-01 DIAGNOSIS — E559 Vitamin D deficiency, unspecified: Secondary | ICD-10-CM | POA: Diagnosis not present

## 2021-07-01 DIAGNOSIS — I429 Cardiomyopathy, unspecified: Secondary | ICD-10-CM | POA: Diagnosis not present

## 2021-07-01 DIAGNOSIS — E785 Hyperlipidemia, unspecified: Secondary | ICD-10-CM | POA: Diagnosis not present

## 2021-07-01 DIAGNOSIS — I10 Essential (primary) hypertension: Secondary | ICD-10-CM | POA: Diagnosis not present

## 2021-07-01 DIAGNOSIS — D696 Thrombocytopenia, unspecified: Secondary | ICD-10-CM | POA: Diagnosis not present

## 2021-07-01 DIAGNOSIS — Z79899 Other long term (current) drug therapy: Secondary | ICD-10-CM | POA: Diagnosis not present

## 2021-07-04 NOTE — Progress Notes (Signed)
Remote pacemaker transmission.   

## 2021-08-12 DIAGNOSIS — Z0000 Encounter for general adult medical examination without abnormal findings: Secondary | ICD-10-CM | POA: Diagnosis not present

## 2021-08-12 DIAGNOSIS — E785 Hyperlipidemia, unspecified: Secondary | ICD-10-CM | POA: Diagnosis not present

## 2021-08-12 DIAGNOSIS — Z9181 History of falling: Secondary | ICD-10-CM | POA: Diagnosis not present

## 2021-08-12 DIAGNOSIS — Z1331 Encounter for screening for depression: Secondary | ICD-10-CM | POA: Diagnosis not present

## 2021-09-01 ENCOUNTER — Other Ambulatory Visit: Payer: Self-pay | Admitting: Cardiology

## 2021-09-10 DIAGNOSIS — S91012A Laceration without foreign body, left ankle, initial encounter: Secondary | ICD-10-CM | POA: Diagnosis not present

## 2021-09-15 DIAGNOSIS — S81812D Laceration without foreign body, left lower leg, subsequent encounter: Secondary | ICD-10-CM | POA: Diagnosis not present

## 2021-09-20 ENCOUNTER — Other Ambulatory Visit: Payer: Self-pay | Admitting: Cardiology

## 2021-09-24 ENCOUNTER — Ambulatory Visit (INDEPENDENT_AMBULATORY_CARE_PROVIDER_SITE_OTHER): Payer: PPO

## 2021-09-24 DIAGNOSIS — I442 Atrioventricular block, complete: Secondary | ICD-10-CM

## 2021-09-24 LAB — CUP PACEART REMOTE DEVICE CHECK
Battery Remaining Longevity: 59 mo
Battery Remaining Percentage: 81 %
Battery Voltage: 2.99 V
Brady Statistic AP VP Percent: 94 %
Brady Statistic AP VS Percent: 1 %
Brady Statistic AS VP Percent: 5.8 %
Brady Statistic AS VS Percent: 1 %
Brady Statistic RA Percent Paced: 94 %
Date Time Interrogation Session: 20230906052501
Implantable Lead Implant Date: 20020201
Implantable Lead Implant Date: 20020201
Implantable Lead Implant Date: 20220608
Implantable Lead Location: 753858
Implantable Lead Location: 753859
Implantable Lead Location: 753860
Implantable Pulse Generator Implant Date: 20220608
Lead Channel Impedance Value: 1125 Ohm
Lead Channel Impedance Value: 240 Ohm
Lead Channel Impedance Value: 790 Ohm
Lead Channel Pacing Threshold Amplitude: 0.5 V
Lead Channel Pacing Threshold Amplitude: 1 V
Lead Channel Pacing Threshold Amplitude: 2.375 V
Lead Channel Pacing Threshold Pulse Width: 0.4 ms
Lead Channel Pacing Threshold Pulse Width: 0.5 ms
Lead Channel Pacing Threshold Pulse Width: 0.8 ms
Lead Channel Sensing Intrinsic Amplitude: 2.3 mV
Lead Channel Setting Pacing Amplitude: 2 V
Lead Channel Setting Pacing Amplitude: 2 V
Lead Channel Setting Pacing Amplitude: 3.375
Lead Channel Setting Pacing Pulse Width: 0.5 ms
Lead Channel Setting Pacing Pulse Width: 0.8 ms
Lead Channel Setting Sensing Sensitivity: 4 mV
Pulse Gen Model: 3562
Pulse Gen Serial Number: 3912796

## 2021-09-30 DIAGNOSIS — E785 Hyperlipidemia, unspecified: Secondary | ICD-10-CM | POA: Diagnosis not present

## 2021-09-30 DIAGNOSIS — E559 Vitamin D deficiency, unspecified: Secondary | ICD-10-CM | POA: Diagnosis not present

## 2021-09-30 DIAGNOSIS — I429 Cardiomyopathy, unspecified: Secondary | ICD-10-CM | POA: Diagnosis not present

## 2021-09-30 DIAGNOSIS — R3129 Other microscopic hematuria: Secondary | ICD-10-CM | POA: Diagnosis not present

## 2021-09-30 DIAGNOSIS — I10 Essential (primary) hypertension: Secondary | ICD-10-CM | POA: Diagnosis not present

## 2021-09-30 DIAGNOSIS — Z6826 Body mass index (BMI) 26.0-26.9, adult: Secondary | ICD-10-CM | POA: Diagnosis not present

## 2021-09-30 DIAGNOSIS — M1990 Unspecified osteoarthritis, unspecified site: Secondary | ICD-10-CM | POA: Diagnosis not present

## 2021-09-30 DIAGNOSIS — D696 Thrombocytopenia, unspecified: Secondary | ICD-10-CM | POA: Diagnosis not present

## 2021-09-30 DIAGNOSIS — Z950 Presence of cardiac pacemaker: Secondary | ICD-10-CM | POA: Diagnosis not present

## 2021-10-02 ENCOUNTER — Other Ambulatory Visit (HOSPITAL_COMMUNITY): Payer: Self-pay

## 2021-10-02 ENCOUNTER — Encounter: Payer: Self-pay | Admitting: Cardiology

## 2021-10-02 ENCOUNTER — Other Ambulatory Visit: Payer: Self-pay

## 2021-10-02 ENCOUNTER — Ambulatory Visit: Payer: PPO | Attending: Cardiology | Admitting: Cardiology

## 2021-10-02 VITALS — BP 126/74 | HR 60 | Ht 71.0 in | Wt 181.4 lb

## 2021-10-02 DIAGNOSIS — I10 Essential (primary) hypertension: Secondary | ICD-10-CM

## 2021-10-02 DIAGNOSIS — E782 Mixed hyperlipidemia: Secondary | ICD-10-CM | POA: Diagnosis not present

## 2021-10-02 DIAGNOSIS — I420 Dilated cardiomyopathy: Secondary | ICD-10-CM | POA: Diagnosis not present

## 2021-10-02 DIAGNOSIS — I442 Atrioventricular block, complete: Secondary | ICD-10-CM | POA: Diagnosis not present

## 2021-10-02 DIAGNOSIS — Z950 Presence of cardiac pacemaker: Secondary | ICD-10-CM | POA: Diagnosis not present

## 2021-10-02 NOTE — Progress Notes (Unsigned)
Cardiology Office Note:    Date:  10/02/2021   ID:  Lorin Picket, DOB Feb 10, 1945, MRN [id number]  PCP:  Nicholos Johns, MD  Cardiologist:  Jenne Campus, MD    Referring MD: Nicholos Johns, MD   Chief Complaint  Patient presents with   Follow-up  Doing well  History of Present Illness:    Alvin Lang is a 75 y.o. 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  He does have history of complete heart block and required dual-chamber pacemaker that I put in many years ago.  Recently he is at battery of the pacemaker ran out and he required pacemaker replacement he is device has been upgraded to BiV pacemaker.  He comes today to muscle follow-up.  Overall doing well.  He lost his wife just few months ago and obviously grieving after that.  They been married more than 50 years.  He does have a son who lives with him and therefore he is holding up quite fine.  Cardiac wise denies have any complaint.  There is no chest pain tightness squeezing pressure burning chest no palpitations no dizziness no swelling of lower extremities  Past Medical History:  Diagnosis Date   Chronic systolic dysfunction of left ventricle    Complete heart block (H[facility])    Dilated cardiomyopathy ([place]) 03/26/2016   Dyslipidemia 01/31/2015   Essential hypertension 01/31/2015   Hyperlipidemia    Hypertension    Nonischemic cardiomyopathy ([place])    Pacemaker 05/19/2018   St.Jude   Pacemaker reprogramming/check 01/31/2015   Overview:  St jude device    Past Surgical History:  Procedure Laterality Date   BIV PACEMAKER INSERTION CRT-P N/A 06/26/2020   Procedure: BIV PACEMAKER INSERTION CRT-P;  Surgeon: Constance Haw, MD;  Location: [facility];  Service: Cardiovascular;  Laterality: N/A;   PACEMAKER GENERATOR CHANGE Left  12/16/2010   SJM Accent DR RF PPM for complete heart block,  generator change by Dr Agustin Cree   PACEMAKER IMPLANT  2002   by Dr Agustin Cree    Current Medications: Current Meds  Medication Sig   atorvastatin (LIPITOR) 20 MG tablet Take 1 tablet by mouth once daily (Patient taking differently: Take 20 mg by mouth daily.)   Calcium Carb-Cholecalciferol (CALCIUM-VITAMIN D) 500-200 MG-UNIT tablet Take 1 tablet by mouth 2 (two) times daily.   carvedilol (COREG) 25 MG tablet Take 1 tablet by mouth twice daily (Patient taking differently: Take 25 mg by mouth 2 (two) times daily with a meal.)   ENTRESTO 49-51 MG Take 1 tablet by mouth twice daily (Patient taking differently: Take 1 tablet by mouth 2 (two) times daily.)   Multiple Vitamins-Minerals (PRESERVISION AREDS PO) Take 1 tablet by mouth daily.   omega-3 fish oil (MAXEPA) 1000 MG CAPS capsule Take 2 capsules by mouth daily.   spironolactone (ALDACTONE) 25 MG tablet Take 1/2 (one-half) tablet by mouth once daily (Patient taking differently: Take 12.5 mg by mouth daily.)   Vitamin D, Ergocalciferol, (DRISDOL) 1.25 MG (50000 UT) CAPS capsule Take 1 capsule by mouth every 21 ( twenty-one) days.      Allergies:   Patient has no known allergies.   Social History   Socioeconomic History   Marital status: Married    Spouse name: Not on file   Number of children: Not on file   Years of education: Not  on file   Highest education level: Not on file  Occupational History   Not on file  Tobacco Use   Smoking status: Former   Smokeless tobacco: Never  Vaping Use   Vaping Use: Never used  Substance and Sexual Activity   Alcohol use: No   Drug use: No   Sexual activity: Not on file  Other Topics Concern   Not on file  Social History Narrative   Not on file   Social Determinants of Health   Financial Resource Strain: Not on file  Food Insecurity: Not on file  Transportation Needs: Not on file  Physical Activity: Not on file  Stress: Not  on file  Social Connections: Not on file     Family History: The patient's family history includes Diabetes in his father; Heart attack in his mother; Heart disease in his father. ROS:   Please see the history of present illness.    All 14 point review of systems negative except as described per history of present illness  EKGs/Labs/Other Studies Reviewed:      Recent Labs: No results found for requested labs within last 365 days.  Recent Lipid Panel    Component Value Date/Time   CHOL 148 11/29/2019 1700   TRIG 194 (H) 11/29/2019 1700   HDL 32 (L) 11/29/2019 1700   CHOLHDL 4.6 11/29/2019 1700   LDLCALC 83 11/29/2019 1700    Physical Exam:    VS:  BP 126/74 (BP Location: Left Arm, Patient Position: Sitting)   Pulse 60   Ht '5\' 11"'$  (1.803 m)   Wt 181 lb 6.4 oz (82.3 kg)   SpO2 95%   BMI 25.30 kg/m     Wt Readings from Last 3 Encounters:  10/02/21 181 lb 6.4 oz (82.3 kg)  01/30/21 178 lb 12.8 oz (81.1 kg)  09/30/20 177 lb 3.2 oz (80.4 kg)     GEN:  Well nourished, well developed in no acute distress HEENT: Normal NECK: No JVD; No carotid bruits LYMPHATICS: No lymphadenopathy CARDIAC: RRR, no murmurs, no rubs, no gallops RESPIRATORY:  Clear to auscultation without rales, wheezing or rhonchi  ABDOMEN: Soft, non-tender, non-distended MUSCULOSKELETAL:  No edema; No deformity  SKIN: Warm and dry LOWER EXTREMITIES: no swelling NEUROLOGIC:  Alert and oriented x 3 PSYCHIATRIC:  Normal affect   ASSESSMENT:    1. Dilated cardiomyopathy ([place])   2. Essential hypertension   3. Complete heart block ([place])   4. Pacemaker   5. Mixed hyperlipidemia    PLAN:    In order of problems listed above:  History of dilated cardiomyopathy echocardiogram done at the beginning of this year showed preserved left ventricle ejection fraction, he is on guideline directed medical therapy doing well.  Continue with Delene Loll continue with Coreg continue with Aldactone Essential  hypertension: Blood pressure well controlled continue present management Dyslipidemia: I did review K PN data from 07/01/2021 show me LDL 71 HDL 24 he is on Lipitor 20 which I will continue. BiV pacer present.  I did review interrogation, 5 years left in the device, normal function,Abbott device   Medication Adjustments/Labs and Tests Ordered: Current medicines are reviewed at length with the patient today.  Concerns regarding medicines are outlined above.  No orders of the defined types were placed in this encounter.  Medication changes: No orders of the defined types were placed in this encounter.   Signed, Park Liter, MD, [facility] 10/02/2021 3:46 PM    [place]

## 2021-10-02 NOTE — Patient Instructions (Signed)

## 2021-10-13 NOTE — Progress Notes (Signed)
Remote pacemaker transmission.   

## 2021-11-18 DIAGNOSIS — H524 Presbyopia: Secondary | ICD-10-CM | POA: Diagnosis not present

## 2021-11-18 DIAGNOSIS — H353132 Nonexudative age-related macular degeneration, bilateral, intermediate dry stage: Secondary | ICD-10-CM | POA: Diagnosis not present

## 2021-12-12 ENCOUNTER — Other Ambulatory Visit: Payer: Self-pay | Admitting: Cardiology

## 2021-12-14 ENCOUNTER — Other Ambulatory Visit: Payer: Self-pay | Admitting: Cardiology

## 2021-12-15 ENCOUNTER — Other Ambulatory Visit: Payer: Self-pay | Admitting: Cardiology

## 2021-12-24 ENCOUNTER — Ambulatory Visit (INDEPENDENT_AMBULATORY_CARE_PROVIDER_SITE_OTHER): Payer: PPO

## 2021-12-24 DIAGNOSIS — I420 Dilated cardiomyopathy: Secondary | ICD-10-CM | POA: Diagnosis not present

## 2021-12-24 LAB — CUP PACEART REMOTE DEVICE CHECK
Battery Remaining Longevity: 56 mo
Battery Remaining Percentage: 77 %
Battery Voltage: 2.99 V
Brady Statistic AP VP Percent: 94 %
Brady Statistic AP VS Percent: 1 %
Brady Statistic AS VP Percent: 6.1 %
Brady Statistic AS VS Percent: 1 %
Brady Statistic RA Percent Paced: 93 %
Date Time Interrogation Session: 20231206050750
Implantable Lead Connection Status: 753985
Implantable Lead Connection Status: 753985
Implantable Lead Connection Status: 753985
Implantable Lead Implant Date: 20020201
Implantable Lead Implant Date: 20020201
Implantable Lead Implant Date: 20220608
Implantable Lead Location: 753858
Implantable Lead Location: 753859
Implantable Lead Location: 753860
Implantable Pulse Generator Implant Date: 20220608
Lead Channel Impedance Value: 1100 Ohm
Lead Channel Impedance Value: 240 Ohm
Lead Channel Impedance Value: 690 Ohm
Lead Channel Pacing Threshold Amplitude: 0.5 V
Lead Channel Pacing Threshold Amplitude: 1 V
Lead Channel Pacing Threshold Amplitude: 2.375 V
Lead Channel Pacing Threshold Pulse Width: 0.4 ms
Lead Channel Pacing Threshold Pulse Width: 0.5 ms
Lead Channel Pacing Threshold Pulse Width: 0.8 ms
Lead Channel Sensing Intrinsic Amplitude: 2 mV
Lead Channel Setting Pacing Amplitude: 2 V
Lead Channel Setting Pacing Amplitude: 2 V
Lead Channel Setting Pacing Amplitude: 3.375
Lead Channel Setting Pacing Pulse Width: 0.5 ms
Lead Channel Setting Pacing Pulse Width: 0.8 ms
Lead Channel Setting Sensing Sensitivity: 4 mV
Pulse Gen Model: 3562
Pulse Gen Serial Number: 3912796

## 2022-01-16 NOTE — Progress Notes (Signed)
Remote pacemaker transmission.   

## 2022-02-03 DIAGNOSIS — R3129 Other microscopic hematuria: Secondary | ICD-10-CM | POA: Diagnosis not present

## 2022-02-03 DIAGNOSIS — E785 Hyperlipidemia, unspecified: Secondary | ICD-10-CM | POA: Diagnosis not present

## 2022-02-03 DIAGNOSIS — E559 Vitamin D deficiency, unspecified: Secondary | ICD-10-CM | POA: Diagnosis not present

## 2022-02-03 DIAGNOSIS — D696 Thrombocytopenia, unspecified: Secondary | ICD-10-CM | POA: Diagnosis not present

## 2022-02-03 DIAGNOSIS — I10 Essential (primary) hypertension: Secondary | ICD-10-CM | POA: Diagnosis not present

## 2022-02-03 DIAGNOSIS — Z950 Presence of cardiac pacemaker: Secondary | ICD-10-CM | POA: Diagnosis not present

## 2022-02-03 DIAGNOSIS — M1990 Unspecified osteoarthritis, unspecified site: Secondary | ICD-10-CM | POA: Diagnosis not present

## 2022-02-03 DIAGNOSIS — Z79899 Other long term (current) drug therapy: Secondary | ICD-10-CM | POA: Diagnosis not present

## 2022-02-03 DIAGNOSIS — I429 Cardiomyopathy, unspecified: Secondary | ICD-10-CM | POA: Diagnosis not present

## 2022-02-03 DIAGNOSIS — Z6827 Body mass index (BMI) 27.0-27.9, adult: Secondary | ICD-10-CM | POA: Diagnosis not present

## 2022-02-03 LAB — LAB REPORT - SCANNED: EGFR: 91

## 2022-03-25 ENCOUNTER — Telehealth: Payer: Self-pay

## 2022-03-25 ENCOUNTER — Ambulatory Visit: Payer: PPO

## 2022-03-25 DIAGNOSIS — I420 Dilated cardiomyopathy: Secondary | ICD-10-CM

## 2022-03-25 LAB — CUP PACEART REMOTE DEVICE CHECK
Battery Remaining Longevity: 51 mo
Battery Remaining Percentage: 73 %
Battery Voltage: 2.99 V
Brady Statistic AP VP Percent: 94 %
Brady Statistic AP VS Percent: 1 %
Brady Statistic AS VP Percent: 6.2 %
Brady Statistic AS VS Percent: 1 %
Brady Statistic RA Percent Paced: 93 %
Date Time Interrogation Session: 20240306055335
Implantable Lead Connection Status: 753985
Implantable Lead Connection Status: 753985
Implantable Lead Connection Status: 753985
Implantable Lead Implant Date: 20020201
Implantable Lead Implant Date: 20020201
Implantable Lead Implant Date: 20220608
Implantable Lead Location: 753858
Implantable Lead Location: 753859
Implantable Lead Location: 753860
Implantable Pulse Generator Implant Date: 20220608
Lead Channel Impedance Value: 1125 Ohm
Lead Channel Impedance Value: 240 Ohm
Lead Channel Impedance Value: 650 Ohm
Lead Channel Pacing Threshold Amplitude: 0.5 V
Lead Channel Pacing Threshold Amplitude: 1 V
Lead Channel Pacing Threshold Amplitude: 3 V
Lead Channel Pacing Threshold Pulse Width: 0.4 ms
Lead Channel Pacing Threshold Pulse Width: 0.5 ms
Lead Channel Pacing Threshold Pulse Width: 0.8 ms
Lead Channel Sensing Intrinsic Amplitude: 2.1 mV
Lead Channel Setting Pacing Amplitude: 2 V
Lead Channel Setting Pacing Amplitude: 2 V
Lead Channel Setting Pacing Amplitude: 4 V
Lead Channel Setting Pacing Pulse Width: 0.5 ms
Lead Channel Setting Pacing Pulse Width: 0.8 ms
Lead Channel Setting Sensing Sensitivity: 4 mV
Pulse Gen Model: 3562
Pulse Gen Serial Number: 3912796

## 2022-03-25 NOTE — Telephone Encounter (Signed)
[employer]:    Scheduled remote reviewed. Normal device function.   Subtle upward trend of LV threshold Pt. had a 9hr 56mn of AF on 2/16, no hx noted, no OAC - route to triage Next remote 91 days. LA  Called patient and let him know of the Atrial fibrillation episode, informed patient that I would forward to Dr. KAgustin Creeto discuss possible blood thinner. Patient voiced understanding

## 2022-03-26 ENCOUNTER — Other Ambulatory Visit: Payer: Self-pay

## 2022-03-26 DIAGNOSIS — I4891 Unspecified atrial fibrillation: Secondary | ICD-10-CM | POA: Diagnosis not present

## 2022-03-26 NOTE — Telephone Encounter (Signed)
° °  Pt is calling back to f/u °

## 2022-03-26 NOTE — Telephone Encounter (Signed)
Called patient and informed him of Dr. Wendy Poet recommendation below:  "Atrial fibrillation noted.  We need to check CBC Chem-7 stool for guaiac with intention to start anticoagulation"  Patient verbalized understanding with this plan and stated that he would come by the office later today to have blood work drawn. Lab work ordered via [employer].He had no further questions at this time.

## 2022-03-27 DIAGNOSIS — I4891 Unspecified atrial fibrillation: Secondary | ICD-10-CM | POA: Diagnosis not present

## 2022-03-27 LAB — CBC
Hematocrit: 38.9 % (ref 37.5–51.0)
Hemoglobin: 13.2 g/dL (ref 13.0–17.7)
MCH: 30.8 pg (ref 26.6–33.0)
MCHC: 33.9 g/dL (ref 31.5–35.7)
MCV: 91 fL (ref 79–97)
Platelets: 104 10*3/uL — ABNORMAL LOW (ref 150–450)
RBC: 4.28 x10E6/uL (ref 4.14–5.80)
RDW: 13.2 % (ref 11.6–15.4)
WBC: 6.6 10*3/uL (ref 3.4–10.8)

## 2022-03-27 LAB — BASIC METABOLIC PANEL
BUN/Creatinine Ratio: 12 (ref 10–24)
BUN: 8 mg/dL (ref 8–27)
CO2: 19 mmol/L — ABNORMAL LOW (ref 20–29)
Calcium: 9 mg/dL (ref 8.6–10.2)
Chloride: 105 mmol/L (ref 96–106)
Creatinine, Ser: 0.67 mg/dL — ABNORMAL LOW (ref 0.76–1.27)
Glucose: 103 mg/dL — ABNORMAL HIGH (ref 70–99)
Potassium: 3.9 mmol/L (ref 3.5–5.2)
Sodium: 141 mmol/L (ref 134–144)
eGFR: 96 mL/min/{1.73_m2} (ref >59)

## 2022-03-29 LAB — FECAL OCCULT BLOOD, IMMUNOCHEMICAL: Fecal Occult Bld: NEGATIVE

## 2022-04-02 ENCOUNTER — Telehealth: Payer: Self-pay | Admitting: Cardiology

## 2022-04-02 ENCOUNTER — Telehealth: Payer: Self-pay

## 2022-04-02 MED ORDER — APIXABAN 5 MG PO TABS: 5.0000 mg | ORAL_TABLET | Freq: Two times a day (BID) | ORAL | 6 refills | Status: DC

## 2022-04-02 NOTE — Telephone Encounter (Signed)
Results reviewed with pt as per Dr. Wendy Poet note. He agreed to start Eliquis '5mg'$  Bid.  Pt verbalized understanding and had no additional questions. Routed to PCP

## 2022-04-02 NOTE — Telephone Encounter (Signed)
Patient was returning call. Please advise ?

## 2022-04-02 NOTE — Telephone Encounter (Signed)
See lab result note.

## 2022-04-08 ENCOUNTER — Encounter: Payer: Self-pay | Admitting: Cardiology

## 2022-04-08 ENCOUNTER — Telehealth: Payer: Self-pay | Admitting: *Deleted

## 2022-04-08 ENCOUNTER — Ambulatory Visit: Payer: PPO | Attending: Cardiology | Admitting: Cardiology

## 2022-04-08 VITALS — BP 140/90 | HR 68 | Ht 71.0 in | Wt 184.0 lb

## 2022-04-08 DIAGNOSIS — I480 Paroxysmal atrial fibrillation: Secondary | ICD-10-CM | POA: Insufficient documentation

## 2022-04-08 DIAGNOSIS — E782 Mixed hyperlipidemia: Secondary | ICD-10-CM

## 2022-04-08 DIAGNOSIS — I519 Heart disease, unspecified: Secondary | ICD-10-CM | POA: Diagnosis not present

## 2022-04-08 DIAGNOSIS — I10 Essential (primary) hypertension: Secondary | ICD-10-CM | POA: Diagnosis not present

## 2022-04-08 DIAGNOSIS — I420 Dilated cardiomyopathy: Secondary | ICD-10-CM | POA: Diagnosis not present

## 2022-04-08 DIAGNOSIS — Z950 Presence of cardiac pacemaker: Secondary | ICD-10-CM

## 2022-04-08 HISTORY — DX: Paroxysmal atrial fibrillation: I48.0

## 2022-04-08 NOTE — Progress Notes (Signed)
Cardiology Office Note:    Date:  04/08/2022   ID:  Alvin Lang, DOB [birth year], MRN [id number]  PCP:  Alvin Johns, MD  Cardiologist:  Jenne Campus, MD    Referring MD: Alvin Johns, MD   Chief Complaint  Patient presents with   Follow-up  Doing fine  History of Present Illness:    Alvin Lang is a 77 y.o. 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  He does have history of complete heart block and required dual-chamber pacemaker that I put in many years ago.  Recently he is at battery of the pacemaker ran out and he required pacemaker replacement he is device has been upgraded to BiV pacemaker.   Comes today to months for follow-up.  New discovery of paroxysmal atrial fibrillation discovered by interrogation of his device.  He has been put on anticoagulation, tolerated with no difficulties and doing well.  Denies have any chest pain tightness squeezing pressure burning chest still very active cath would not have no difficulty doing it.  Past Medical History:  Diagnosis Date   Chronic systolic dysfunction of left ventricle    Complete heart block (HCC)    Dilated cardiomyopathy ([place]) 03/26/2016   Dyslipidemia 01/31/2015   Essential hypertension 01/31/2015   Hyperlipidemia    Hypertension    Nonischemic cardiomyopathy ([place])    Pacemaker 05/19/2018   St.Jude   Pacemaker reprogramming/check 01/31/2015   Overview:  St jude device    Past Surgical History:  Procedure Laterality Date   BIV PACEMAKER INSERTION CRT-P N/A 06/26/2020   Procedure: BIV PACEMAKER INSERTION CRT-P;  Surgeon: Constance Haw, MD;  Location: [facility];  Service: Cardiovascular;  Laterality: N/A;   PACEMAKER GENERATOR CHANGE Left 12/16/2010   SJM Accent DR RF PPM for complete heart block,  generator change  by Dr Agustin Cree   PACEMAKER IMPLANT  2002   by Dr Agustin Cree    Current Medications: Current Meds  Medication Sig   apixaban (ELIQUIS) 5 MG TABS tablet Take 1 tablet (5 mg total) by mouth 2 (two) times daily.   atorvastatin (LIPITOR) 20 MG tablet Take 1 tablet by mouth once daily   Calcium Carb-Cholecalciferol (CALCIUM-VITAMIN D) 500-200 MG-UNIT tablet Take 1 tablet by mouth 2 (two) times daily.   carvedilol (COREG) 25 MG tablet Take 1 tablet by mouth twice daily   Multiple Vitamins-Minerals (PRESERVISION AREDS PO) Take 1 tablet by mouth daily.   omega-3 fish oil (MAXEPA) 1000 MG CAPS capsule Take 2 capsules by mouth daily.   sacubitril-valsartan (ENTRESTO) 49-51 MG Take 1 tablet by mouth twice daily   spironolactone (ALDACTONE) 25 MG tablet Take 1/2 (one-half) tablet by mouth once daily   Vitamin D, Ergocalciferol, (DRISDOL) 1.25 MG (50000 UT) CAPS capsule Take 1 capsule by mouth every 21 ( twenty-one) days.      Allergies:   Patient has no known allergies.   Social History   Socioeconomic History   Marital status: Widowed    Spouse name: Not on file   Number of children: Not on file   Years of education: Not on file   Highest education level: Not on file  Occupational History   Not on file  Tobacco Use   Smoking status: Former   Smokeless tobacco: Never  Vaping Use   Vaping Use: Never used  Substance and Sexual  Activity   Alcohol use: No   Drug use: No   Sexual activity: Not on file  Other Topics Concern   Not on file  Social History Narrative   Not on file   Social Determinants of Health   Financial Resource Strain: Not on file  Food Insecurity: Not on file  Transportation Needs: Not on file  Physical Activity: Not on file  Stress: Not on file  Social Connections: Not on file     Family History: The patient's family history includes Diabetes in his father; Heart attack in his mother; Heart disease in his father. ROS:   Please see the history of present  illness.    All 14 point review of systems negative except as described per history of present illness  EKGs/Labs/Other Studies Reviewed:      Recent Labs: 03/26/2022: BUN 8; Creatinine, Ser 0.67; Hemoglobin 13.2; Platelets 104; Potassium 3.9; Sodium 141  Recent Lipid Panel    Component Value Date/Time   CHOL 148 11/29/2019 1700   TRIG 194 (H) 11/29/2019 1700   HDL 32 (L) 11/29/2019 1700   CHOLHDL 4.6 11/29/2019 1700   LDLCALC 83 11/29/2019 1700    Physical Exam:    VS:  BP (!) 140/90 (BP Location: Left Arm, Patient Position: Sitting, Cuff Size: Normal)   Pulse 68   Ht 5\' 11"  (1.803 m)   Wt 184 lb (83.5 kg)   SpO2 97%   BMI 25.66 kg/m     Wt Readings from Last 3 Encounters:  04/08/22 184 lb (83.5 kg)  10/02/21 181 lb 6.4 oz (82.3 kg)  01/30/21 178 lb 12.8 oz (81.1 kg)     GEN:  Well nourished, well developed in no acute distress HEENT: Normal NECK: No JVD; No carotid bruits LYMPHATICS: No lymphadenopathy CARDIAC: RRR, no murmurs, no rubs, no gallops RESPIRATORY:  Clear to auscultation without rales, wheezing or rhonchi  ABDOMEN: Soft, non-tender, non-distended MUSCULOSKELETAL:  No edema; No deformity  SKIN: Warm and dry LOWER EXTREMITIES: no swelling NEUROLOGIC:  Alert and oriented x 3 PSYCHIATRIC:  Normal affect   ASSESSMENT:    1. Paroxysmal atrial fibrillation (HCC)   2. Dilated cardiomyopathy ([place])   3. Chronic systolic dysfunction of left ventricle   4. Essential hypertension   5. Mixed hyperlipidemia   6. Pacemaker    PLAN:    In order of problems listed above:  Paroxysmal atrial fibrillation which is a new diagnosis.  His CHADS2 vascular equals 4, he is anticoagulated will continue.  Good tolerance.  Etiology of this phenomenon is unclear at the moment however he described the fact that when his wife was alive he was she was complaining of him snoring we will schedule him to have sleep study. History of dilated cardiomyopathy on guideline directed  medical therapy with improvement based on previous echocardiogram I will repeat echocardiogram to recheck left ventricle ejection fraction. Essential hypertension blood pressure well-controlled we will continue present management. Mixed dyslipidemia I did review his K PN I have data from January 2024 which show LDL 76 HDL 26 he is on Lipitor 20 which I will continue.   Medication Adjustments/Labs and Tests Ordered: Current medicines are reviewed at length with the patient today.  Concerns regarding medicines are outlined above.  No orders of the defined types were placed in this encounter.  Medication changes: No orders of the defined types were placed in this encounter.   Signed, Park Liter, MD, [facility] 04/08/2022 8:50 AM    [facility]

## 2022-04-08 NOTE — Patient Instructions (Signed)
Medication Instructions:  Your physician recommends that you continue on your current medications as directed. Please refer to the Current Medication list given to you today.  *If you need a refill on your cardiac medications before your next appointment, please call your pharmacy*   Lab Work: None Ordered If you have labs (blood work) drawn today and your tests are completely normal, you will receive your results only by: [place] (if you have MyChart) OR A paper copy in the mail If you have any lab test that is abnormal or we need to change your treatment, we will call you to review the results.   Testing/Procedures: Your physician has requested that you have an echocardiogram. Echocardiography is a painless test that uses sound waves to create images of your heart. It provides your doctor with information about the size and shape of your heart and how well your heart's chambers and valves are working. This procedure takes approximately one hour. There are no restrictions for this procedure. Please do NOT wear cologne, perfume, aftershave, or lotions (deodorant is allowed). Please arrive 15 minutes prior to your appointment time.    Follow-Up: At [facility], you and your health needs are our priority.  As part of our continuing mission to provide you with exceptional heart care, we have created designated Provider Care Teams.  These Care Teams include your primary Cardiologist (physician) and Advanced Practice Providers (APPs -  Physician Assistants and Nurse Practitioners) who all work together to provide you with the care you need, when you need it.  We recommend signing up for the patient portal called "MyChart".  Sign up information is provided on this After Visit Summary.  MyChart is used to connect with patients for Virtual Visits (Telemedicine).  Patients are able to view lab/test results, encounter notes, upcoming appointments, etc.  Non-urgent messages can be sent to your  provider as well.   To learn more about what you can do with MyChart, go to [email or web address].    Your next appointment:   6 month(s)  The format for your next appointment:   In Person  Provider:   Jenne Campus, MD    Other Instructions Itamar-sleep study- will call when to start test

## 2022-04-08 NOTE — Addendum Note (Signed)
Addended by: Jacobo Forest D on: 04/08/2022 09:08 AM   Modules accepted: Orders

## 2022-04-08 NOTE — Telephone Encounter (Signed)
Secure chat message sent to Lisa Harris ok to activate itamar device. 

## 2022-04-12 ENCOUNTER — Encounter (INDEPENDENT_AMBULATORY_CARE_PROVIDER_SITE_OTHER): Payer: PPO | Admitting: Cardiology

## 2022-04-12 DIAGNOSIS — R0683 Snoring: Secondary | ICD-10-CM

## 2022-04-12 DIAGNOSIS — I480 Paroxysmal atrial fibrillation: Secondary | ICD-10-CM

## 2022-04-14 ENCOUNTER — Telehealth: Payer: Self-pay | Admitting: *Deleted

## 2022-04-14 ENCOUNTER — Ambulatory Visit: Payer: PPO | Attending: Cardiology

## 2022-04-14 DIAGNOSIS — I480 Paroxysmal atrial fibrillation: Secondary | ICD-10-CM

## 2022-04-14 NOTE — Telephone Encounter (Signed)
-----   Message from Sueanne Margarita, MD sent at 04/14/2022 10:03 AM EDT ----- Please let patient know that sleep study showed no significant sleep apnea.

## 2022-04-14 NOTE — Procedures (Signed)
   SLEEP STUDY REPORT Patient Information Study Date: 04/12/2022 Patient Name: Marcques Weghorst Patient ID: [id number] Birth Date: 1945/04/29 Age: 77 Gender: Male BMI: 35.0 (W=185 lb, H=5' 1'') Referring Physician: Jenne Campus, MD  TEST DESCRIPTION: Home sleep apnea testing was completed using the WatchPat, a Type 1 device, utilizing peripheral arterial tonometry (PAT), chest movement, actigraphy, pulse oximetry, pulse rate, body position and snore. AHI was calculated with apnea and hypopnea using valid sleep time as the denominator. RDI includes apneas, hypopneas, and RERAs. The data acquired and the scoring of sleep and all associated events were performed in accordance with the recommended standards and specifications as outlined in the AASM Manual for the Scoring of Sleep and Associated Events 2.2.0 (2015).   FINDINGS: 1.  No evidence of Obstructive Sleep Apnea with AHI 4.4/hr.  2.  No Central Sleep Apnea. 3.  Oxygen desaturations as low as 86%. 4.  Moderate snoring was present. O2 sats were < 88% for 0.75minutes. 5.  Total sleep time was 8 hrs and 53 min. 6.  10.9% of total sleep time was spent in REM sleep.  7.  Normal sleep onset latency at 23 min.  8.  Prolonged REM sleep onset latency at 268 min.  9.  Total awakenings were 4.   DIAGNOSIS:  Normal study with no significant sleep disordered breathing.  RECOMMENDATIONS:   1. Normal study with no significant sleep disordered breathing.  2.  Healthy sleep recommendations include:  adequate nightly sleep (normal 7-9 hrs/night), avoidance of caffeine after noon and alcohol near bedtime, and maintaining a sleep environment that is cool, dark and quiet.  3.  Weight loss for overweight patients is recommended.    4.  Snoring recommendations include:  weight loss where appropriate, side sleeping, and avoidance of alcohol before bed.  5.  Operation of motor vehicle or dangerous equipment must be avoided when feeling drowsy,  excessively sleepy, or mentally fatigued.    6.  An ENT consultation which may be useful for specific causes of and possible treatment of bothersome snoring.   7. Weight loss may be of benefit in reducing the severity of snoring.   Signature: Fransico Him, MD; [facility]; Diplomat, American Board of Sleep Medicine Electronically Signed: 04/14/2022

## 2022-04-14 NOTE — Telephone Encounter (Signed)
Sleep study results left on answering machine. Patient advised to call back if questions.

## 2022-04-21 ENCOUNTER — Ambulatory Visit: Payer: PPO | Attending: Cardiology

## 2022-04-21 DIAGNOSIS — I420 Dilated cardiomyopathy: Secondary | ICD-10-CM

## 2022-04-21 LAB — ECHOCARDIOGRAM COMPLETE
Area-P 1/2: 3.51 cm2
MV M vel: 4.93 m/s
MV Peak grad: 97.2 mmHg
S' Lateral: 3.9 cm

## 2022-04-24 ENCOUNTER — Telehealth: Payer: Self-pay

## 2022-04-24 NOTE — Telephone Encounter (Signed)
Left message on My Chart with normal results per Dr. Krasowski's note. Routed to PCP. 

## 2022-04-24 NOTE — Telephone Encounter (Signed)
Pt viewed results in My Chart. Routed to PCP.  

## 2022-04-28 DIAGNOSIS — I10 Essential (primary) hypertension: Secondary | ICD-10-CM | POA: Diagnosis not present

## 2022-04-28 DIAGNOSIS — D696 Thrombocytopenia, unspecified: Secondary | ICD-10-CM | POA: Diagnosis not present

## 2022-04-28 DIAGNOSIS — Z139 Encounter for screening, unspecified: Secondary | ICD-10-CM | POA: Diagnosis not present

## 2022-04-28 DIAGNOSIS — E785 Hyperlipidemia, unspecified: Secondary | ICD-10-CM | POA: Diagnosis not present

## 2022-04-28 DIAGNOSIS — Z6827 Body mass index (BMI) 27.0-27.9, adult: Secondary | ICD-10-CM | POA: Diagnosis not present

## 2022-04-28 DIAGNOSIS — M1990 Unspecified osteoarthritis, unspecified site: Secondary | ICD-10-CM | POA: Diagnosis not present

## 2022-04-28 DIAGNOSIS — E559 Vitamin D deficiency, unspecified: Secondary | ICD-10-CM | POA: Diagnosis not present

## 2022-04-29 NOTE — Progress Notes (Signed)
Remote pacemaker transmission.   

## 2022-06-24 ENCOUNTER — Ambulatory Visit (INDEPENDENT_AMBULATORY_CARE_PROVIDER_SITE_OTHER): Payer: PPO

## 2022-06-24 DIAGNOSIS — I442 Atrioventricular block, complete: Secondary | ICD-10-CM | POA: Diagnosis not present

## 2022-06-24 LAB — CUP PACEART REMOTE DEVICE CHECK
Battery Remaining Longevity: 47 mo
Battery Remaining Percentage: 68 %
Battery Voltage: 2.99 V
Brady Statistic AP VP Percent: 94 %
Brady Statistic AP VS Percent: 1 %
Brady Statistic AS VP Percent: 5.9 %
Brady Statistic AS VS Percent: 1 %
Brady Statistic RA Percent Paced: 94 %
Date Time Interrogation Session: 20240605040028
Implantable Lead Connection Status: 753985
Implantable Lead Connection Status: 753985
Implantable Lead Connection Status: 753985
Implantable Lead Implant Date: 20020201
Implantable Lead Implant Date: 20020201
Implantable Lead Implant Date: 20220608
Implantable Lead Location: 753858
Implantable Lead Location: 753859
Implantable Lead Location: 753860
Implantable Pulse Generator Implant Date: 20220608
Lead Channel Impedance Value: 1125 Ohm
Lead Channel Impedance Value: 240 Ohm
Lead Channel Impedance Value: 640 Ohm
Lead Channel Pacing Threshold Amplitude: 0.625 V
Lead Channel Pacing Threshold Amplitude: 1 V
Lead Channel Pacing Threshold Amplitude: 3.125 V
Lead Channel Pacing Threshold Pulse Width: 0.4 ms
Lead Channel Pacing Threshold Pulse Width: 0.5 ms
Lead Channel Pacing Threshold Pulse Width: 0.8 ms
Lead Channel Sensing Intrinsic Amplitude: 12 mV
Lead Channel Sensing Intrinsic Amplitude: 2.2 mV
Lead Channel Setting Pacing Amplitude: 2 V
Lead Channel Setting Pacing Amplitude: 2 V
Lead Channel Setting Pacing Amplitude: 4.125
Lead Channel Setting Pacing Pulse Width: 0.5 ms
Lead Channel Setting Pacing Pulse Width: 0.8 ms
Lead Channel Setting Sensing Sensitivity: 4 mV
Pulse Gen Model: 3562
Pulse Gen Serial Number: 3912796

## 2022-07-21 NOTE — Progress Notes (Signed)
Remote pacemaker transmission.   

## 2022-08-18 DIAGNOSIS — Z79899 Other long term (current) drug therapy: Secondary | ICD-10-CM | POA: Diagnosis not present

## 2022-08-18 DIAGNOSIS — E785 Hyperlipidemia, unspecified: Secondary | ICD-10-CM | POA: Diagnosis not present

## 2022-08-18 DIAGNOSIS — R3129 Other microscopic hematuria: Secondary | ICD-10-CM | POA: Diagnosis not present

## 2022-08-18 DIAGNOSIS — M1990 Unspecified osteoarthritis, unspecified site: Secondary | ICD-10-CM | POA: Diagnosis not present

## 2022-08-18 DIAGNOSIS — Z6827 Body mass index (BMI) 27.0-27.9, adult: Secondary | ICD-10-CM | POA: Diagnosis not present

## 2022-08-18 DIAGNOSIS — Z9181 History of falling: Secondary | ICD-10-CM | POA: Diagnosis not present

## 2022-08-18 DIAGNOSIS — D696 Thrombocytopenia, unspecified: Secondary | ICD-10-CM | POA: Diagnosis not present

## 2022-08-18 DIAGNOSIS — E559 Vitamin D deficiency, unspecified: Secondary | ICD-10-CM | POA: Diagnosis not present

## 2022-08-18 DIAGNOSIS — I10 Essential (primary) hypertension: Secondary | ICD-10-CM | POA: Diagnosis not present

## 2022-08-26 DIAGNOSIS — Z0000 Encounter for general adult medical examination without abnormal findings: Secondary | ICD-10-CM | POA: Diagnosis not present

## 2022-08-26 DIAGNOSIS — Z9181 History of falling: Secondary | ICD-10-CM | POA: Diagnosis not present

## 2022-09-01 ENCOUNTER — Other Ambulatory Visit: Payer: Self-pay | Admitting: Cardiology

## 2022-09-14 ENCOUNTER — Ambulatory Visit: Payer: PPO | Attending: Cardiology | Admitting: Cardiology

## 2022-09-14 ENCOUNTER — Encounter: Payer: Self-pay | Admitting: Cardiology

## 2022-09-14 VITALS — BP 138/80 | HR 60 | Ht 71.0 in | Wt 182.6 lb

## 2022-09-14 DIAGNOSIS — I519 Heart disease, unspecified: Secondary | ICD-10-CM | POA: Diagnosis not present

## 2022-09-14 DIAGNOSIS — I442 Atrioventricular block, complete: Secondary | ICD-10-CM

## 2022-09-14 NOTE — Progress Notes (Signed)
  Electrophysiology Office Note:   Date:  09/14/2022  ID:  Alvin Lang, DOB 1946-01-20, MRN [id number]  Primary Cardiologist: Gypsy Balsam, MD Electrophysiologist: Regan Lemming, MD      History of Present Illness:   Alvin Lang is a 77 y.o. male with h/o heart block, heart failure seen today for routine electrophysiology followup.  Since last being seen in our clinic the patient reports doing well.  He has no chest pain or shortness of breath.  Able to do all of his daily activities..  he denies chest pain, palpitations, dyspnea, PND, orthopnea, nausea, vomiting, dizziness, syncope, edema, weight gain, or early satiety.      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 He is status post Saint Jude dual-chamber pacemaker implanted for complete heart block. As his ejection fraction went down, he is now status post CRT-P upgrade on 06/26/2020.        Review of systems complete and found to be negative unless listed in HPI.      EP Information / Studies Reviewed:    EKG is ordered today. Personal review as below.   Sinus rhythm, ventricular paced  PPM Interrogation-  reviewed in detail today,  See PACEART report.  Device History: Abbott BiV PPM implanted 06/26/20 for CHB  Risk Assessment/Calculations:              Physical Exam:   VS:  BP 138/80 (BP Location: Left Arm, Patient Position: Sitting, Cuff Size: Large)   Pulse 60   Ht 5\' 11"  (1.803 m)   Wt 182 lb 9.6 oz (82.8 kg)   SpO2 97%   BMI 25.47 kg/m    Wt Readings from Last 3 Encounters:  09/14/22 182 lb 9.6 oz (82.8 kg)  04/08/22 184 lb (83.5 kg)  10/02/21 181 lb 6.4 oz (82.3 kg)     GEN: Well nourished, well developed in no acute distress NECK: No JVD; No carotid bruits CARDIAC: Regular rate and rhythm, no murmurs, rubs, gallops RESPIRATORY:  Clear to auscultation without rales, wheezing or  rhonchi  ABDOMEN: Soft, non-tender, non-distended EXTREMITIES:  No edema; No deformity   ASSESSMENT AND PLAN:    CHB s/p Abbott PPM  Normal PPM function See Pace Art report LV threshold elevated today.  Has been adjusted through the device clinic.  2.  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  Currently on optimal medical therapy per primary cardiology.  Post CRT-P upgrade.  No obvious volume overload.  3.  Hypertension: Currently well-controlled  4.  Paroxysmal atrial fibrillation: Currently on Eliquis.  Minimally symptomatic.  No changes.  5.  Secondary hypercoagulable state: Currently on Eliquis for atrial fibrillation  Disposition:   Follow up with Dr. Elberta Fortis in 12 months  Signed, Miciah Shealy Jorja Loa, MD

## 2022-09-16 LAB — CUP PACEART INCLINIC DEVICE CHECK
Battery Remaining Longevity: 31 mo
Battery Voltage: 2.98 V
Brady Statistic RA Percent Paced: 94 %
Brady Statistic RV Percent Paced: 99.87 %
Date Time Interrogation Session: 20240826151200
Implantable Lead Connection Status: 753985
Implantable Lead Connection Status: 753985
Implantable Lead Connection Status: 753985
Implantable Lead Implant Date: 20020201
Implantable Lead Implant Date: 20020201
Implantable Lead Implant Date: 20220608
Implantable Lead Location: 753858
Implantable Lead Location: 753859
Implantable Lead Location: 753860
Implantable Pulse Generator Implant Date: 20220608
Lead Channel Impedance Value: 237.5 Ohm
Lead Channel Impedance Value: 737.5 Ohm
Lead Channel Impedance Value: 787.5 Ohm
Lead Channel Pacing Threshold Amplitude: 0.5 V
Lead Channel Pacing Threshold Amplitude: 0.5 V
Lead Channel Pacing Threshold Amplitude: 1 V
Lead Channel Pacing Threshold Amplitude: 1 V
Lead Channel Pacing Threshold Amplitude: 1.75 V
Lead Channel Pacing Threshold Amplitude: 1.75 V
Lead Channel Pacing Threshold Pulse Width: 0.4 ms
Lead Channel Pacing Threshold Pulse Width: 0.4 ms
Lead Channel Pacing Threshold Pulse Width: 0.5 ms
Lead Channel Pacing Threshold Pulse Width: 0.5 ms
Lead Channel Pacing Threshold Pulse Width: 1 ms
Lead Channel Pacing Threshold Pulse Width: 1 ms
Lead Channel Sensing Intrinsic Amplitude: 12 mV
Lead Channel Sensing Intrinsic Amplitude: 2.3 mV
Lead Channel Setting Pacing Amplitude: 2 V
Lead Channel Setting Pacing Amplitude: 2 V
Lead Channel Setting Pacing Amplitude: 2.75 V
Lead Channel Setting Pacing Pulse Width: 0.5 ms
Lead Channel Setting Pacing Pulse Width: 1 ms
Lead Channel Setting Sensing Sensitivity: 4 mV
Pulse Gen Model: 3562
Pulse Gen Serial Number: 3912796

## 2022-09-23 ENCOUNTER — Ambulatory Visit (INDEPENDENT_AMBULATORY_CARE_PROVIDER_SITE_OTHER): Payer: PPO

## 2022-09-23 DIAGNOSIS — I442 Atrioventricular block, complete: Secondary | ICD-10-CM | POA: Diagnosis not present

## 2022-09-24 LAB — CUP PACEART REMOTE DEVICE CHECK
Battery Remaining Longevity: 37 mo
Battery Remaining Percentage: 63 %
Battery Voltage: 2.98 V
Brady Statistic AP VP Percent: 95 %
Brady Statistic AP VS Percent: 1 %
Brady Statistic AS VP Percent: 5.3 %
Brady Statistic AS VS Percent: 1 %
Brady Statistic RA Percent Paced: 95 %
Date Time Interrogation Session: 20240904041618
Implantable Lead Connection Status: 753985
Implantable Lead Connection Status: 753985
Implantable Lead Connection Status: 753985
Implantable Lead Implant Date: 20020201
Implantable Lead Implant Date: 20020201
Implantable Lead Implant Date: 20220608
Implantable Lead Location: 753858
Implantable Lead Location: 753859
Implantable Lead Location: 753860
Implantable Pulse Generator Implant Date: 20220608
Lead Channel Impedance Value: 240 Ohm
Lead Channel Impedance Value: 640 Ohm
Lead Channel Impedance Value: 760 Ohm
Lead Channel Pacing Threshold Amplitude: 0.5 V
Lead Channel Pacing Threshold Amplitude: 1 V
Lead Channel Pacing Threshold Amplitude: 2.125 V
Lead Channel Pacing Threshold Pulse Width: 0.4 ms
Lead Channel Pacing Threshold Pulse Width: 0.5 ms
Lead Channel Pacing Threshold Pulse Width: 1 ms
Lead Channel Sensing Intrinsic Amplitude: 12 mV
Lead Channel Sensing Intrinsic Amplitude: 2.1 mV
Lead Channel Setting Pacing Amplitude: 2 V
Lead Channel Setting Pacing Amplitude: 2 V
Lead Channel Setting Pacing Amplitude: 3.125
Lead Channel Setting Pacing Pulse Width: 0.5 ms
Lead Channel Setting Pacing Pulse Width: 1 ms
Lead Channel Setting Sensing Sensitivity: 4 mV
Pulse Gen Model: 3562
Pulse Gen Serial Number: 3912796

## 2022-10-01 ENCOUNTER — Encounter: Payer: Self-pay | Admitting: Cardiology

## 2022-10-01 ENCOUNTER — Ambulatory Visit: Payer: PPO | Attending: Cardiology | Admitting: Cardiology

## 2022-10-01 VITALS — BP 136/70 | HR 60 | Ht 71.0 in | Wt 182.6 lb

## 2022-10-01 DIAGNOSIS — I442 Atrioventricular block, complete: Secondary | ICD-10-CM | POA: Diagnosis not present

## 2022-10-01 DIAGNOSIS — I10 Essential (primary) hypertension: Secondary | ICD-10-CM

## 2022-10-01 DIAGNOSIS — I519 Heart disease, unspecified: Secondary | ICD-10-CM | POA: Diagnosis not present

## 2022-10-01 DIAGNOSIS — I480 Paroxysmal atrial fibrillation: Secondary | ICD-10-CM

## 2022-10-01 DIAGNOSIS — Z950 Presence of cardiac pacemaker: Secondary | ICD-10-CM

## 2022-10-01 NOTE — Patient Instructions (Signed)

## 2022-10-01 NOTE — Progress Notes (Signed)
Cardiology Office Note:    Date:  10/01/2022   ID:  Alvin Lang, DOB 1945-06-01, MRN [id number]  PCP:  Lucianne Lei, MD  Cardiologist:  Gypsy Balsam, MD    Referring MD: Lucianne Lei, MD   Chief Complaint  Patient presents with   Follow-up    History of Present Illness:    Alvin Lang is a 77 y.o. male   with past medical history significant for nonischemic cardiomyopathy with low-dose ejection fraction of 25%, however, significant improvement with Entresto beta-blocker and Aldactone to latest ejection fraction of 45 to 50%.  He does have history of complete heart block and required dual-chamber pacemaker that I put in many years ago.  Recently he is at battery of the pacemaker ran out and he required pacemaker replacement he is device has been upgraded to BiV pacemaker.   Comes today to months for follow-up we will doing well.  He perform activities daily living with no difficulties.  Denies having extraordinary shortness of breath swelling of lower EXTR palpitation dizziness or passing out.  Overall doing well  Past Medical History:  Diagnosis Date   Chronic systolic dysfunction of left ventricle    Complete heart block (HCC)    Dilated cardiomyopathy (HCC) 03/26/2016   Dyslipidemia 01/31/2015   Essential hypertension 01/31/2015   Hyperlipidemia    Hypertension    Nonischemic cardiomyopathy (HCC)    Pacemaker 05/19/2018   St.Jude   Pacemaker reprogramming/check 01/31/2015   Overview:  St jude device    Past Surgical History:  Procedure Laterality Date   BIV PACEMAKER INSERTION CRT-P N/A 06/26/2020   Procedure: BIV PACEMAKER INSERTION CRT-P;  Surgeon: Regan Lemming, MD;  Location: [facility] INVASIVE [facility] LAB;  Service: Cardiovascular;  Laterality: N/A;   PACEMAKER GENERATOR CHANGE Left 12/16/2010   SJM Acc[employer] DR RF PPM for complete heart block,  generator change by Dr Bing Matter   PACEMAKER IMPLANT  2002   by Dr Bing Matter    Current Medications: Current Meds   Medication Sig   apixaban (ELIQUIS) 5 MG TABS tablet Take 1 tablet (5 mg total) by mouth 2 (two) times daily.   atorvastatin (LIPITOR) 20 MG tablet Take 1 tablet by mouth once daily (Patient taking differently: Take 20 mg by mouth daily.)   carvedilol (COREG) 25 MG tablet Take 1 tablet by mouth twice daily (Patient taking differently: Take 25 mg by mouth 2 (two) times daily with a meal.)   ENTRESTO 49-51 MG Take 1 tablet by mouth twice daily (Patient taking differently: Take 1 tablet by mouth 2 (two) times daily.)   Multiple Vitamins-Minerals (PRESERVISION AREDS PO) Take 1 tablet by mouth daily.   omega-3 fish oil (MAXEPA) 1000 MG CAPS capsule Take 2 capsules by mouth daily.   spironolactone (ALDACTONE) 25 MG tablet Take 1/2 (one-half) tablet by mouth once daily (Patient taking differently: Take 12.5 mg by mouth daily.)   [DISCONTINUED] Calcium Carb-Cholecalciferol (CALCIUM-VITAMIN D) 500-200 MG-UNIT tablet Take 1 tablet by mouth 2 (two) times daily.   [DISCONTINUED] Vitamin D, Ergocalciferol, (DRISDOL) 1.25 MG (50000 UT) CAPS capsule Take 1 capsule by mouth every 21 ( twenty-one) days.      Allergies:   Patient has no known allergies.   Social History   Socioeconomic History   Marital status: Widowed    Spouse name: Not on file   Number of children: Not on file   Years of education: Not on file   Highest education level: Not on file  Occupational History  Not on file  Tobacco Use   Smoking status: Former   Smokeless tobacco: Never  Vaping Use   Vaping status: Never Used  Substance and Sexual Activity   Alcohol use: No   Drug use: No   Sexual activity: Not on file  Other Topics Concern   Not on file  Social History Narrative   Not on file   Social Determinants of Health   Financial Resource Strain: Not on file  Food Insecurity: Not on file  Transportation Needs: Not on file  Physical Activity: Not on file  Stress: Not on file  Social Connections: Not on file      Family History: The patient's family history includes Diabetes in his father; Heart attack in his mother; Heart disease in his father. ROS:   Please see the history of present illness.    All 14 point review of systems negative except as described per history of present illness  EKGs/Labs/Other Studies Reviewed:         Recent Labs: 03/26/2022: BUN 8; Creatinine, Ser 0.67; Hemoglobin 13.2; Platelets 104; Potassium 3.9; Sodium 141  Recent Lipid Panel    Component Value Date/Time   CHOL 148 11/29/2019 1700   TRIG 194 (H) 11/29/2019 1700   HDL 32 (L) 11/29/2019 1700   CHOLHDL 4.6 11/29/2019 1700   LDLCALC 83 11/29/2019 1700    Physical Exam:    VS:  BP 136/70 (BP Location: Left Arm, Patient Position: Sitting)   Pulse 60   Ht 5\' 11"  (1.803 m)   Wt 182 lb 9.6 oz (82.8 kg)   SpO2 94%   BMI 25.47 kg/m     Wt Readings from Last 3 Encounters:  10/01/22 182 lb 9.6 oz (82.8 kg)  09/14/22 182 lb 9.6 oz (82.8 kg)  04/08/22 184 lb (83.5 kg)     GEN:  Well nourished, well developed in no acute distress HEENT: Normal NECK: No JVD; No carotid bruits LYMPHATICS: No lymphadenopathy CARDIAC: RRR, no murmurs, no rubs, no gallops RESPIRATORY:  Clear to auscultation without rales, wheezing or rhonchi  ABDOMEN: Soft, non-tender, non-distended MUSCULOSKELETAL:  No edema; No deformity  SKIN: Warm and dry LOWER EXTREMITIES: no swelling NEUROLOGIC:  Alert and oriented x 3 PSYCHIATRIC:  Normal affect   ASSESSMENT:    1. Paroxysmal atrial fibrillation (HCC)   2. Chronic systolic dysfunction of left ventricle   3. Complete heart block (HCC)   4. Essential hypertension   5. Pacemaker    PLAN:    In order of problems listed above:  Paroxysmal atrial fibrillation.  Maintained sinus rhythm.  Anticoagulated continue present management. Chronic systolic congestive heart show ejection fraction 4550% on guideline directed medical therapy which I will continue.  I will add some extra  medication but he is reluctant he said he is feeling fine he is doing overall okay. Essential hypertension blood pressure well-controlled continue present management. 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  Concerns regarding medicines are outlined above.  Orders Placed This Encounter  Procedures   EKG 12-Lead   Medication changes: No orders of the defined types were placed in this encounter.   Signed, Georgeanna Lea, MD, [facility] 10/01/2022 1:32 PM    [facility]

## 2022-10-07 NOTE — Progress Notes (Signed)
Remote pacemaker transmission.   

## 2022-10-09 ENCOUNTER — Other Ambulatory Visit: Payer: Self-pay | Admitting: Cardiology

## 2022-10-09 NOTE — Telephone Encounter (Signed)
Prescription refill request for Eliquis received. Indication:afib Last office visit:9/24 Scr:0.78  7/24 Age: 77 Weight:82.8  kg  Prescription refilled

## 2022-10-24 ENCOUNTER — Other Ambulatory Visit: Payer: Self-pay | Admitting: Cardiology

## 2022-11-21 ENCOUNTER — Other Ambulatory Visit: Payer: Self-pay | Admitting: Cardiology

## 2022-11-23 DIAGNOSIS — H353132 Nonexudative age-related macular degeneration, bilateral, intermediate dry stage: Secondary | ICD-10-CM | POA: Diagnosis not present

## 2022-11-23 DIAGNOSIS — H40012 Open angle with borderline findings, low risk, left eye: Secondary | ICD-10-CM | POA: Diagnosis not present

## 2022-11-26 DIAGNOSIS — I10 Essential (primary) hypertension: Secondary | ICD-10-CM | POA: Diagnosis not present

## 2022-11-26 DIAGNOSIS — R3129 Other microscopic hematuria: Secondary | ICD-10-CM | POA: Diagnosis not present

## 2022-11-26 DIAGNOSIS — I429 Cardiomyopathy, unspecified: Secondary | ICD-10-CM | POA: Diagnosis not present

## 2022-11-26 DIAGNOSIS — Z23 Encounter for immunization: Secondary | ICD-10-CM | POA: Diagnosis not present

## 2022-11-26 DIAGNOSIS — E559 Vitamin D deficiency, unspecified: Secondary | ICD-10-CM | POA: Diagnosis not present

## 2022-11-26 DIAGNOSIS — D696 Thrombocytopenia, unspecified: Secondary | ICD-10-CM | POA: Diagnosis not present

## 2022-11-26 DIAGNOSIS — E785 Hyperlipidemia, unspecified: Secondary | ICD-10-CM | POA: Diagnosis not present

## 2022-11-26 DIAGNOSIS — M1990 Unspecified osteoarthritis, unspecified site: Secondary | ICD-10-CM | POA: Diagnosis not present

## 2022-11-26 DIAGNOSIS — Z950 Presence of cardiac pacemaker: Secondary | ICD-10-CM | POA: Diagnosis not present

## 2022-11-26 DIAGNOSIS — R739 Hyperglycemia, unspecified: Secondary | ICD-10-CM | POA: Diagnosis not present

## 2022-11-29 ENCOUNTER — Other Ambulatory Visit: Payer: Self-pay | Admitting: Cardiology

## 2022-12-23 ENCOUNTER — Ambulatory Visit (INDEPENDENT_AMBULATORY_CARE_PROVIDER_SITE_OTHER): Payer: PPO

## 2022-12-23 DIAGNOSIS — I442 Atrioventricular block, complete: Secondary | ICD-10-CM | POA: Diagnosis not present

## 2022-12-23 LAB — CUP PACEART REMOTE DEVICE CHECK
Battery Remaining Longevity: 37 mo
Battery Remaining Percentage: 59 %
Battery Voltage: 2.98 V
Brady Statistic AP VP Percent: 94 %
Brady Statistic AP VS Percent: 1 %
Brady Statistic AS VP Percent: 6.1 %
Brady Statistic AS VS Percent: 1 %
Brady Statistic RA Percent Paced: 94 %
Date Time Interrogation Session: 20241204055709
Implantable Lead Connection Status: 753985
Implantable Lead Connection Status: 753985
Implantable Lead Connection Status: 753985
Implantable Lead Implant Date: 20020201
Implantable Lead Implant Date: 20020201
Implantable Lead Implant Date: 20220608
Implantable Lead Location: 753858
Implantable Lead Location: 753859
Implantable Lead Location: 753860
Implantable Pulse Generator Implant Date: 20220608
Lead Channel Impedance Value: 240 Ohm
Lead Channel Impedance Value: 650 Ohm
Lead Channel Impedance Value: 780 Ohm
Lead Channel Pacing Threshold Amplitude: 0.625 V
Lead Channel Pacing Threshold Amplitude: 1 V
Lead Channel Pacing Threshold Amplitude: 2 V
Lead Channel Pacing Threshold Pulse Width: 0.4 ms
Lead Channel Pacing Threshold Pulse Width: 0.5 ms
Lead Channel Pacing Threshold Pulse Width: 1 ms
Lead Channel Sensing Intrinsic Amplitude: 1.9 mV
Lead Channel Sensing Intrinsic Amplitude: 12 mV
Lead Channel Setting Pacing Amplitude: 2 V
Lead Channel Setting Pacing Amplitude: 2 V
Lead Channel Setting Pacing Amplitude: 3 V
Lead Channel Setting Pacing Pulse Width: 0.5 ms
Lead Channel Setting Pacing Pulse Width: 1 ms
Lead Channel Setting Sensing Sensitivity: 4 mV
Pulse Gen Model: 3562
Pulse Gen Serial Number: 3912796

## 2022-12-29 ENCOUNTER — Telehealth: Payer: Self-pay | Admitting: Cardiology

## 2022-12-29 DIAGNOSIS — R3129 Other microscopic hematuria: Secondary | ICD-10-CM | POA: Diagnosis not present

## 2022-12-29 NOTE — Telephone Encounter (Signed)
Left voice message regarding message and recommendations from Dr. Dulce Sellar to go to PCP for UA. Encouraged to call with any questions.

## 2022-12-29 NOTE — Telephone Encounter (Signed)
Pt c/o medication issue:  1. Name of Medication: apixaban (ELIQUIS) 5 MG TABS tablet  2. How are you currently taking this medication (dosage and times per day)?  Twice daily--morning and night  3. Are you having a reaction (difficulty breathing--STAT)?   4. What is your medication issue?  Patient states he is seeing brown in his urine and he thinks the Eliquis may be causing it. He states it normally occurs with strenuous activity.

## 2023-01-05 ENCOUNTER — Telehealth: Payer: Self-pay | Admitting: Cardiology

## 2023-01-05 NOTE — Telephone Encounter (Signed)
Patient called asking to see if we received lab results from Dr. Dorcas Carrow office. He said we should have received it in the last week or two. CB # [phone]

## 2023-01-07 ENCOUNTER — Telehealth: Payer: Self-pay | Admitting: Cardiology

## 2023-01-07 NOTE — Telephone Encounter (Signed)
Patient states he had a urine test that was ordered by [place] at his PCP office and the results were faxed to [place] by them.  He was calling for the results.

## 2023-01-08 ENCOUNTER — Telehealth: Payer: Self-pay

## 2023-01-08 NOTE — Telephone Encounter (Signed)
Spoke with pt. He stated that he went to Dr. Novella Rob office and had a UA per Dr. Hulen Shouts advise. As of today he has not heard anything regarding the results. He stated that his dark urine only occurs when he does strenuous activity but clears within a day. Advised to seek care from PCP if urine is dark or bloody, if he has burning with urination. Will call to get UA results from Dr. Mathis Bud.

## 2023-01-08 NOTE — Telephone Encounter (Signed)
Received UA results from Dr. Mathis Bud. Reviewed by Dr. Bing Matter. He recommended that he see Dr. Mathis Bud for a follow up specimen to follow up on the hematuria. Spoke with pt. He verbalized understanding and had no further questions.

## 2023-02-28 ENCOUNTER — Other Ambulatory Visit: Payer: Self-pay | Admitting: Cardiology

## 2023-03-24 ENCOUNTER — Ambulatory Visit (INDEPENDENT_AMBULATORY_CARE_PROVIDER_SITE_OTHER): Payer: PPO

## 2023-03-24 DIAGNOSIS — I442 Atrioventricular block, complete: Secondary | ICD-10-CM

## 2023-03-26 LAB — CUP PACEART REMOTE DEVICE CHECK
Battery Remaining Longevity: 33 mo
Battery Remaining Percentage: 54 %
Battery Voltage: 2.98 V
Brady Statistic AP VP Percent: 94 %
Brady Statistic AP VS Percent: 1 %
Brady Statistic AS VP Percent: 5.5 %
Brady Statistic AS VS Percent: 1 %
Brady Statistic RA Percent Paced: 94 %
Date Time Interrogation Session: 20250305044005
Implantable Lead Connection Status: 753985
Implantable Lead Connection Status: 753985
Implantable Lead Connection Status: 753985
Implantable Lead Implant Date: 20020201
Implantable Lead Implant Date: 20020201
Implantable Lead Implant Date: 20220608
Implantable Lead Location: 753858
Implantable Lead Location: 753859
Implantable Lead Location: 753860
Implantable Pulse Generator Implant Date: 20220608
Lead Channel Impedance Value: 240 Ohm
Lead Channel Impedance Value: 640 Ohm
Lead Channel Impedance Value: 760 Ohm
Lead Channel Pacing Threshold Amplitude: 0.625 V
Lead Channel Pacing Threshold Amplitude: 1 V
Lead Channel Pacing Threshold Amplitude: 2 V
Lead Channel Pacing Threshold Pulse Width: 0.4 ms
Lead Channel Pacing Threshold Pulse Width: 0.5 ms
Lead Channel Pacing Threshold Pulse Width: 1 ms
Lead Channel Sensing Intrinsic Amplitude: 12 mV
Lead Channel Sensing Intrinsic Amplitude: 2.3 mV
Lead Channel Setting Pacing Amplitude: 2 V
Lead Channel Setting Pacing Amplitude: 2 V
Lead Channel Setting Pacing Amplitude: 3 V
Lead Channel Setting Pacing Pulse Width: 0.5 ms
Lead Channel Setting Pacing Pulse Width: 1 ms
Lead Channel Setting Sensing Sensitivity: 4 mV
Pulse Gen Model: 3562
Pulse Gen Serial Number: 3912796

## 2023-04-06 DIAGNOSIS — M1990 Unspecified osteoarthritis, unspecified site: Secondary | ICD-10-CM | POA: Diagnosis not present

## 2023-04-06 DIAGNOSIS — E785 Hyperlipidemia, unspecified: Secondary | ICD-10-CM | POA: Diagnosis not present

## 2023-04-06 DIAGNOSIS — R3129 Other microscopic hematuria: Secondary | ICD-10-CM | POA: Diagnosis not present

## 2023-04-06 DIAGNOSIS — I10 Essential (primary) hypertension: Secondary | ICD-10-CM | POA: Diagnosis not present

## 2023-04-06 DIAGNOSIS — Z79899 Other long term (current) drug therapy: Secondary | ICD-10-CM | POA: Diagnosis not present

## 2023-04-06 DIAGNOSIS — E559 Vitamin D deficiency, unspecified: Secondary | ICD-10-CM | POA: Diagnosis not present

## 2023-04-06 DIAGNOSIS — R739 Hyperglycemia, unspecified: Secondary | ICD-10-CM | POA: Diagnosis not present

## 2023-04-06 DIAGNOSIS — Z6825 Body mass index (BMI) 25.0-25.9, adult: Secondary | ICD-10-CM | POA: Diagnosis not present

## 2023-04-06 DIAGNOSIS — D696 Thrombocytopenia, unspecified: Secondary | ICD-10-CM | POA: Diagnosis not present

## 2023-04-07 LAB — LAB REPORT - SCANNED
EGFR: 90
TSH: 1.97

## 2023-04-07 LAB — HEMOGLOBIN A1C: A1c: 5.5

## 2023-04-14 ENCOUNTER — Encounter: Payer: Self-pay | Admitting: Cardiology

## 2023-04-14 ENCOUNTER — Ambulatory Visit: Payer: PPO | Attending: Cardiology | Admitting: Cardiology

## 2023-04-14 VITALS — BP 126/72 | HR 66 | Ht 71.0 in | Wt 185.6 lb

## 2023-04-14 DIAGNOSIS — Z950 Presence of cardiac pacemaker: Secondary | ICD-10-CM

## 2023-04-14 DIAGNOSIS — I519 Heart disease, unspecified: Secondary | ICD-10-CM

## 2023-04-14 DIAGNOSIS — I480 Paroxysmal atrial fibrillation: Secondary | ICD-10-CM

## 2023-04-14 IMAGING — DX DG CHEST 2V
2 series · 2 of 2 positions shown · non-contrast
Comparison: 06/26/2020

CLINICAL DATA: Suspected lead dislodgement.

EXAM:
CHEST - 2 VIEW

[dg chest 2 view (1 of 2)]
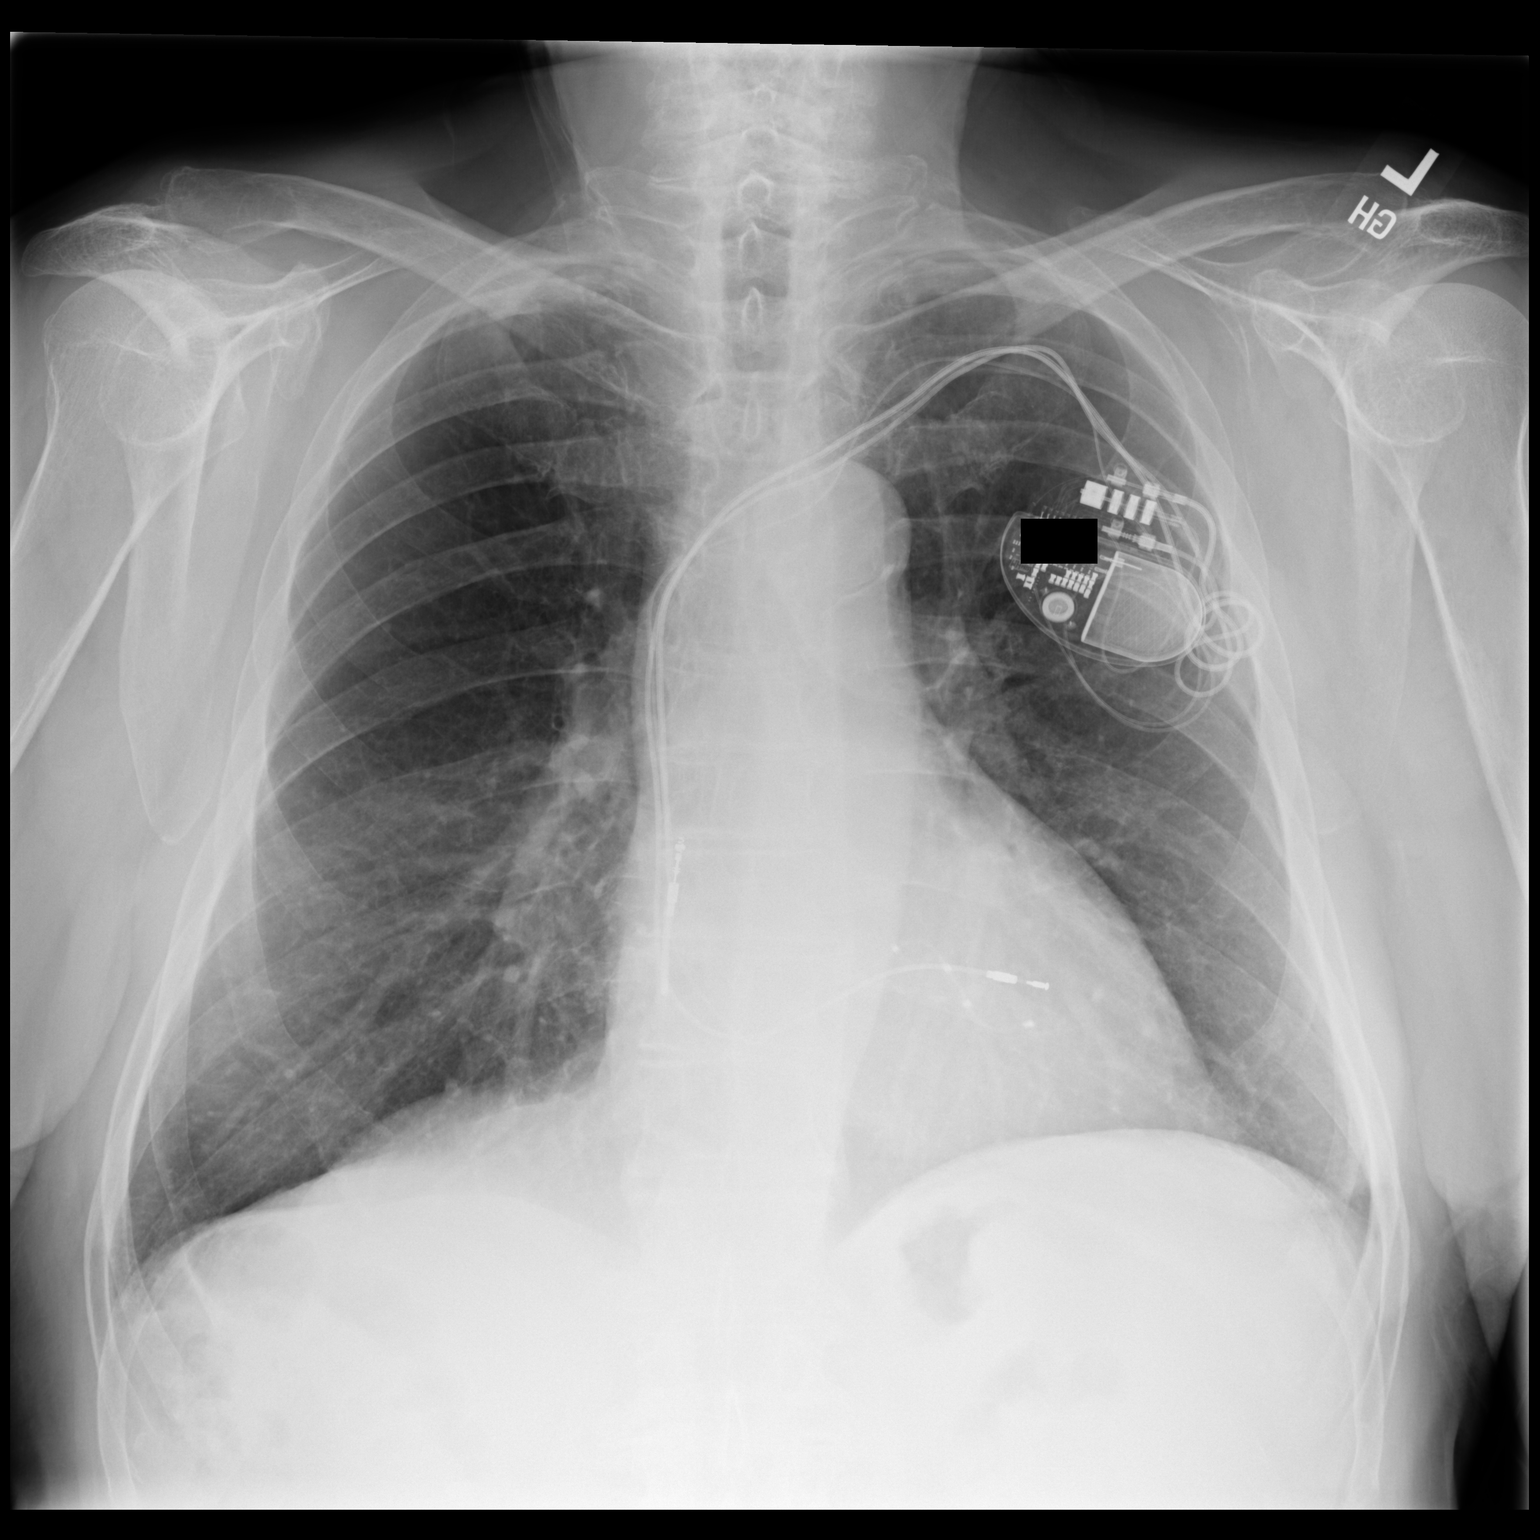

[dg chest 2 view (2 of 2)]
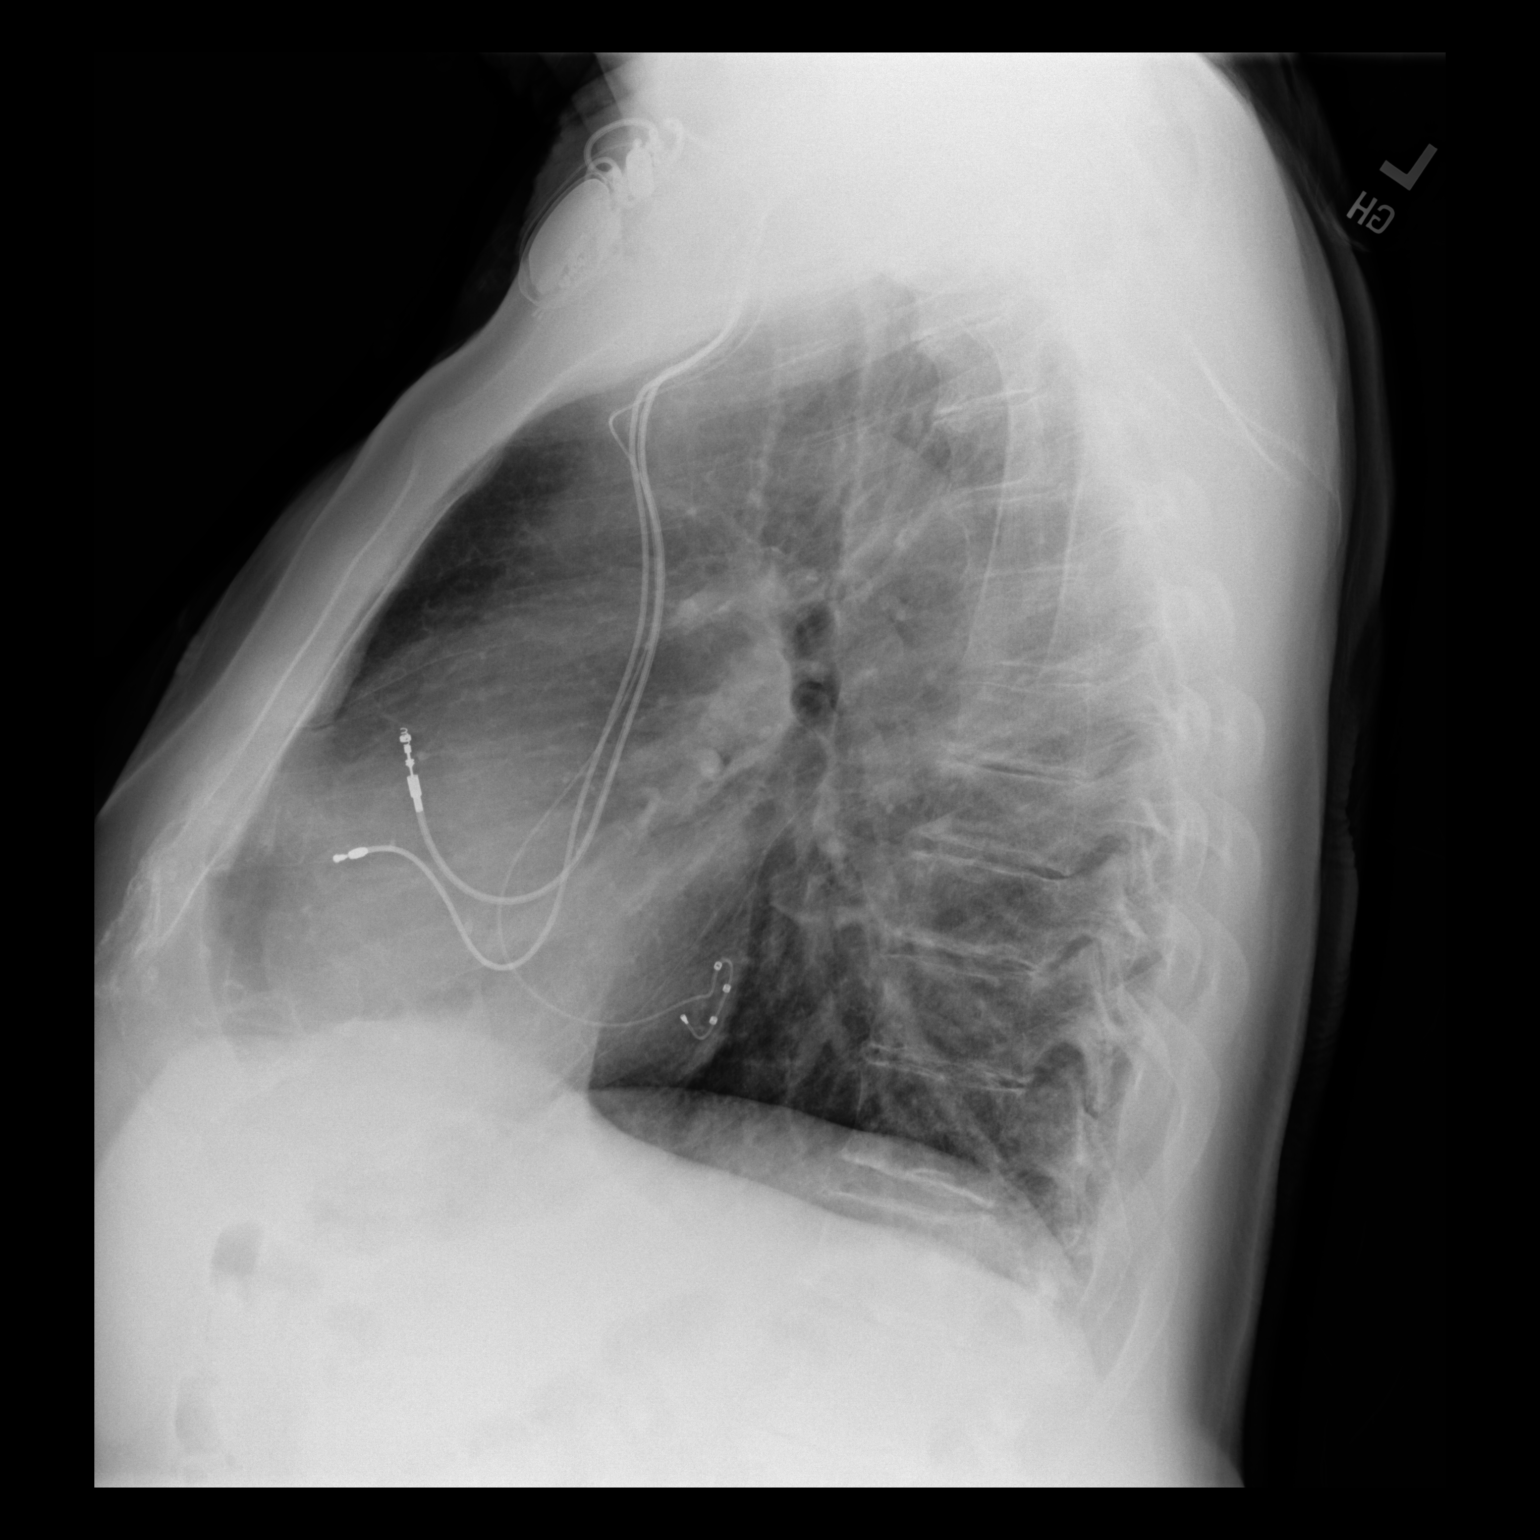

[2 of 2 positions shown; findings below may reference images not displayed]

FINDINGS: Left subclavian transvenous pacemaker appears unchanged. Leads in
the right atrium, right ventral common coronary sinus appear
unchanged. Heart size normal.

Normal vascularity. Lungs well aerated and clear without infiltrate
or effusion.
IMPRESSION: No active cardiopulmonary disease.

## 2023-04-14 NOTE — Patient Instructions (Signed)
 Medication Instructions:  Your physician recommends that you continue on your current medications as directed. Please refer to the Current Medication list given to you today.  *If you need a refill on your cardiac medications before your next appointment, please call your pharmacy*   Lab Work: None If you have labs (blood work) drawn today and your tests are completely normal, you will receive your results only by: MyChart Message (if you have MyChart) OR A paper copy in the mail If you have any lab test that is abnormal or we need to change your treatment, we will call you to review the results.   Testing/Procedures: Your physician has requested that you have an echocardiogram. Echocardiography is a painless test that uses sound waves to create images of your heart. It provides your doctor with information about the size and shape of your heart and how well your heart's chambers and valves are working. This procedure takes approximately one hour. There are no restrictions for this procedure. Please do NOT wear cologne, perfume, aftershave, or lotions (deodorant is allowed). Please arrive 15 minutes prior to your appointment time.  Please note: We ask at that you not bring children with you during ultrasound (echo/ vascular) testing. Due to room size and safety concerns, children are not allowed in the ultrasound rooms during exams. Our front office staff cannot provide observation of children in our lobby area while testing is being conducted. An adult accompanying a patient to their appointment will only be allowed in the ultrasound room at the discretion of the ultrasound technician under special circumstances. We apologize for any inconvenience.    Follow-Up: At [facility], you and your health needs are our priority.  As part of our continuing mission to provide you with exceptional heart care, we have created designated Provider Care Teams.  These Care Teams include your  primary Cardiologist (physician) and Advanced Practice Providers (APPs -  Physician Assistants and Nurse Practitioners) who all work together to provide you with the care you need, when you need it.  We recommend signing up for the patient portal called "MyChart".  Sign up information is provided on this After Visit Summary.  MyChart is used to connect with patients for Virtual Visits (Telemedicine).  Patients are able to view lab/test results, encounter notes, upcoming appointments, etc.  Non-urgent messages can be sent to your provider as well.   To learn more about what you can do with MyChart, go to [email or web address].    Your next appointment:   6 month(s)  Provider:   Gypsy Balsam, MD    Other Instructions None

## 2023-04-14 NOTE — Progress Notes (Unsigned)
 Cardiology Office Note:    Date:  04/14/2023   ID:  Alvin Lang, DOB Feb 15, 1945, MRN [id number]  PCP:  Lucianne Lei, MD  Cardiologist:  Gypsy Balsam, MD    Referring MD: Lucianne Lei, MD   Chief Complaint  Patient presents with   Follow-up    History of Present Illness:    Alvin Lang is a 78 y.o. 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  He does have history of complete heart block and required dual-chamber pacemaker that I put in many years ago.  Recently he is at battery of the pacemaker ran out and he required pacemaker replacement he is device has been upgraded to BiV pacemaker.   Comes today to months for follow-up overall doing great.  Asymptomatic no chest pain tightness squeezing pressure burning chest.  He said he does not have as much energy as he used to have but he understand also that he is not getting young.  No swelling of lower extremities no paroxysmal nocturnal dyspnea.  Past Medical History:  Diagnosis Date   Chronic systolic dysfunction of left ventricle    Complete heart block (HCC)    Dilated cardiomyopathy (HCC) 03/26/2016   Dyslipidemia 01/31/2015   Essential hypertension 01/31/2015   Hyperlipidemia    Hypertension    Nonischemic cardiomyopathy (HCC)    Pacemaker 05/19/2018   St.Jude   Pacemaker reprogramming/check 01/31/2015   Overview:  St jude device    Past Surgical History:  Procedure Laterality Date   BIV PACEMAKER INSERTION CRT-P N/A 06/26/2020   Procedure: BIV PACEMAKER INSERTION CRT-P;  Surgeon: Regan Lemming, MD;  [facility];  Service: Cardiovascular;  Laterality: N/A;   PACEMAKER GENERATOR CHANGE Left 12/16/2010   SJM Accent DR RF PPM for complete heart block,  generator change by Dr Bing Matter   PACEMAKER IMPLANT  2002   by  Dr Bing Matter    Current Medications: Current Meds  Medication Sig   apixaban (ELIQUIS) 5 MG TABS tablet Take 1 tablet by mouth twice daily   atorvastatin (LIPITOR) 20 MG tablet Take 1 tablet by mouth once daily (Patient taking differently: Take 20 mg by mouth daily.)   carvedilol (COREG) 25 MG tablet Take 1 tablet by mouth twice daily   Multiple Vitamins-Minerals (PRESERVISION AREDS PO) Take 1 tablet by mouth daily.   omega-3 fish oil (MAXEPA) 1000 MG CAPS capsule Take 2 capsules by mouth daily.   sacubitril-valsartan (ENTRESTO) 49-51 MG Take 1 tablet by mouth twice daily (Patient taking differently: Take 1 tablet by mouth 2 (two) times daily.)   spironolactone (ALDACTONE) 25 MG tablet Take 1/2 (one-half) tablet by mouth once daily (Patient taking differently: Take 12.5 mg by mouth daily.)     Allergies:   Patient has no known allergies.   Social History   Socioeconomic History   Marital status: Widowed    Spouse name: Not on file   Number of children: Not on file   Years of education: Not on file   Highest education level: Not on file  Occupational History   Not on file  Tobacco Use   Smoking status: Former   Smokeless tobacco: Never  Vaping Use   Vaping status: Never Used  Substance and Sexual Activity   Alcohol use: No   Drug use: No   Sexual activity: Not on file  Other Topics Concern   Not on file  Social History Narrative   Not on file   Social Drivers of Health   Financial Resource Strain: Not on file  Food Insecurity: Not on file  Transportation Needs: Not on file  Physical Activity: Not on file  Stress: Not on file  Social Connections: Not on file     Family History: The patient's family history includes Diabetes in his father; Heart attack in his mother; Heart disease in his father. ROS:   Please see the history of present illness.    All 14 point review of systems negative except as described per history of present illness  EKGs/Labs/Other Studies  Reviewed:         Recent Labs: No results found for requested labs within last 365 days.  Recent Lipid Panel    Component Value Date/Time   CHOL 148 11/29/2019 1700   TRIG 194 (H) 11/29/2019 1700   HDL 32 (L) 11/29/2019 1700   CHOLHDL 4.6 11/29/2019 1700   LDLCALC 83 11/29/2019 1700    Physical Exam:    VS:  BP 126/72 (BP Location: Right Arm, Patient Position: Sitting)   Pulse 66   Ht 5\' 11"  (1.803 m)   Wt 185 lb 9.6 oz (84.2 kg)   SpO2 95%   BMI 25.89 kg/m     Wt Readings from Last 3 Encounters:  04/14/23 185 lb 9.6 oz (84.2 kg)  10/01/22 182 lb 9.6 oz (82.8 kg)  09/14/22 182 lb 9.6 oz (82.8 kg)     GEN:  Well nourished, well developed in no acute distress HEENT: Normal NECK: No JVD; No carotid bruits LYMPHATICS: No lymphadenopathy CARDIAC: RRR, no murmurs, no rubs, no gallops RESPIRATORY:  Clear to auscultation without rales, wheezing or rhonchi  ABDOMEN: Soft, non-tender, non-distended MUSCULOSKELETAL:  No edema; No deformity  SKIN: Warm and dry LOWER EXTREMITIES: no swelling NEUROLOGIC:  Alert and oriented x 3 PSYCHIATRIC:  Normal affect   ASSESSMENT:    1. Chronic systolic dysfunction of left ventricle   2. Paroxysmal atrial fibrillation (HCC)   3. Pacemaker    PLAN:    In order of problems listed above:  Chronic systolic congestive heart failure.  I will schedule him to have another echocardiogram to assess left ventricular ejection fraction. Paroxysmal atrial fibrillation.  Denies having any palpitations, interrogation of the device showed very short episodes of atrial fibrillation.  Asymptomatic continue anticoagulation. Pacemaker present did review interrogation of the device is stable battery voltage 2.98, 3 years or months left on the device.   Medication Adjustments/Labs and Tests Ordered: Current medicines are reviewed at length with the patient today.  Concerns regarding medicines are outlined above.  No orders of the defined types were  placed in this encounter.  Medication changes: No orders of the defined types were placed in this encounter.   Signed, Georgeanna Lea, MD, [facility] 04/14/2023 8:23 AM    [facility]

## 2023-04-17 ENCOUNTER — Other Ambulatory Visit: Payer: Self-pay | Admitting: Cardiology

## 2023-04-19 NOTE — Telephone Encounter (Signed)
 Prescription refill request for Eliquis received. Indication: PAF Last office visit: 04/14/23  Kandyce Rud MD Scr: 0.8 on 04/06/23  [facility] Age: 78 Weight: 84.2kg  Based on above findings Eliquis 5mg  twice daily is the appropriate dose.  Refill approved.

## 2023-05-06 ENCOUNTER — Ambulatory Visit

## 2023-05-07 NOTE — Addendum Note (Signed)
 Addended by: Lott Rouleau A on: 05/07/2023 04:05 PM   Modules accepted: Orders

## 2023-05-07 NOTE — Progress Notes (Signed)
 Remote pacemaker transmission.

## 2023-05-13 ENCOUNTER — Ambulatory Visit: Attending: Cardiology

## 2023-05-13 DIAGNOSIS — I519 Heart disease, unspecified: Secondary | ICD-10-CM

## 2023-05-13 DIAGNOSIS — I480 Paroxysmal atrial fibrillation: Secondary | ICD-10-CM | POA: Diagnosis not present

## 2023-05-13 DIAGNOSIS — Z950 Presence of cardiac pacemaker: Secondary | ICD-10-CM | POA: Diagnosis not present

## 2023-05-13 LAB — ECHOCARDIOGRAM COMPLETE
Calc EF: 48.1 %
MV M vel: 5.43 m/s
MV Peak grad: 117.9 mmHg
Radius: 0.7 cm
S' Lateral: 4.3 cm
Single Plane A2C EF: 47.6 %
Single Plane A4C EF: 49.8 %

## 2023-05-21 ENCOUNTER — Telehealth: Payer: Self-pay

## 2023-05-21 NOTE — Telephone Encounter (Signed)
 Echo Results reviewed with pt as per Dr. Vanetta Shawl note.  Pt verbalized understanding and had no additional questions. Routed to PCP

## 2023-06-09 ENCOUNTER — Other Ambulatory Visit: Payer: Self-pay | Admitting: Cardiology

## 2023-06-23 ENCOUNTER — Ambulatory Visit (INDEPENDENT_AMBULATORY_CARE_PROVIDER_SITE_OTHER): Payer: PPO

## 2023-06-23 DIAGNOSIS — I442 Atrioventricular block, complete: Secondary | ICD-10-CM | POA: Diagnosis not present

## 2023-06-23 LAB — CUP PACEART REMOTE DEVICE CHECK
Battery Remaining Longevity: 30 mo
Battery Remaining Percentage: 49 %
Battery Voltage: 2.96 V
Brady Statistic AP VP Percent: 95 %
Brady Statistic AP VS Percent: 1 %
Brady Statistic AS VP Percent: 4.9 %
Brady Statistic AS VS Percent: 1 %
Brady Statistic RA Percent Paced: 95 %
Date Time Interrogation Session: 20250604041801
Implantable Lead Connection Status: 753985
Implantable Lead Connection Status: 753985
Implantable Lead Connection Status: 753985
Implantable Lead Implant Date: 20020201
Implantable Lead Implant Date: 20020201
Implantable Lead Implant Date: 20220608
Implantable Lead Location: 753858
Implantable Lead Location: 753859
Implantable Lead Location: 753860
Implantable Pulse Generator Implant Date: 20220608
Lead Channel Impedance Value: 240 Ohm
Lead Channel Impedance Value: 680 Ohm
Lead Channel Impedance Value: 790 Ohm
Lead Channel Pacing Threshold Amplitude: 0.625 V
Lead Channel Pacing Threshold Amplitude: 1 V
Lead Channel Pacing Threshold Amplitude: 2 V
Lead Channel Pacing Threshold Pulse Width: 0.4 ms
Lead Channel Pacing Threshold Pulse Width: 0.5 ms
Lead Channel Pacing Threshold Pulse Width: 1 ms
Lead Channel Sensing Intrinsic Amplitude: 12 mV
Lead Channel Sensing Intrinsic Amplitude: 2 mV
Lead Channel Setting Pacing Amplitude: 2 V
Lead Channel Setting Pacing Amplitude: 2 V
Lead Channel Setting Pacing Amplitude: 3 V
Lead Channel Setting Pacing Pulse Width: 0.5 ms
Lead Channel Setting Pacing Pulse Width: 1 ms
Lead Channel Setting Sensing Sensitivity: 4 mV
Pulse Gen Model: 3562
Pulse Gen Serial Number: 3912796

## 2023-06-29 ENCOUNTER — Ambulatory Visit: Payer: Self-pay | Admitting: Cardiology

## 2023-07-21 ENCOUNTER — Other Ambulatory Visit: Payer: Self-pay | Admitting: Cardiology

## 2023-08-03 DIAGNOSIS — D696 Thrombocytopenia, unspecified: Secondary | ICD-10-CM | POA: Diagnosis not present

## 2023-08-03 DIAGNOSIS — Z6826 Body mass index (BMI) 26.0-26.9, adult: Secondary | ICD-10-CM | POA: Diagnosis not present

## 2023-08-03 DIAGNOSIS — I429 Cardiomyopathy, unspecified: Secondary | ICD-10-CM | POA: Diagnosis not present

## 2023-08-03 DIAGNOSIS — E559 Vitamin D deficiency, unspecified: Secondary | ICD-10-CM | POA: Diagnosis not present

## 2023-08-03 DIAGNOSIS — I10 Essential (primary) hypertension: Secondary | ICD-10-CM | POA: Diagnosis not present

## 2023-08-03 DIAGNOSIS — R739 Hyperglycemia, unspecified: Secondary | ICD-10-CM | POA: Diagnosis not present

## 2023-08-03 DIAGNOSIS — M1990 Unspecified osteoarthritis, unspecified site: Secondary | ICD-10-CM | POA: Diagnosis not present

## 2023-08-16 NOTE — Progress Notes (Signed)
 Remote pacemaker transmission.

## 2023-08-25 ENCOUNTER — Other Ambulatory Visit: Payer: Self-pay | Admitting: Cardiology

## 2023-09-22 ENCOUNTER — Ambulatory Visit

## 2023-09-22 DIAGNOSIS — I442 Atrioventricular block, complete: Secondary | ICD-10-CM

## 2023-09-23 LAB — CUP PACEART REMOTE DEVICE CHECK
Battery Remaining Longevity: 26 mo
Battery Remaining Percentage: 43 %
Battery Voltage: 2.96 V
Brady Statistic AP VP Percent: 95 %
Brady Statistic AP VS Percent: 1 %
Brady Statistic AS VP Percent: 4.6 %
Brady Statistic AS VS Percent: 1 %
Brady Statistic RA Percent Paced: 95 %
Date Time Interrogation Session: 20250903050408
Implantable Lead Connection Status: 753985
Implantable Lead Connection Status: 753985
Implantable Lead Connection Status: 753985
Implantable Lead Implant Date: 20020201
Implantable Lead Implant Date: 20020201
Implantable Lead Implant Date: 20220608
Implantable Lead Location: 753858
Implantable Lead Location: 753859
Implantable Lead Location: 753860
Implantable Pulse Generator Implant Date: 20220608
Lead Channel Impedance Value: 240 Ohm
Lead Channel Impedance Value: 800 Ohm
Lead Channel Impedance Value: 830 Ohm
Lead Channel Pacing Threshold Amplitude: 0.5 V
Lead Channel Pacing Threshold Amplitude: 1 V
Lead Channel Pacing Threshold Amplitude: 2.5 V
Lead Channel Pacing Threshold Pulse Width: 0.4 ms
Lead Channel Pacing Threshold Pulse Width: 0.5 ms
Lead Channel Pacing Threshold Pulse Width: 1 ms
Lead Channel Sensing Intrinsic Amplitude: 1.8 mV
Lead Channel Sensing Intrinsic Amplitude: 12 mV
Lead Channel Setting Pacing Amplitude: 2 V
Lead Channel Setting Pacing Amplitude: 2 V
Lead Channel Setting Pacing Amplitude: 3.5 V
Lead Channel Setting Pacing Pulse Width: 0.5 ms
Lead Channel Setting Pacing Pulse Width: 1 ms
Lead Channel Setting Sensing Sensitivity: 4 mV
Pulse Gen Model: 3562
Pulse Gen Serial Number: 3912796

## 2023-09-24 ENCOUNTER — Ambulatory Visit: Payer: Self-pay | Admitting: Cardiology

## 2023-09-25 NOTE — Progress Notes (Unsigned)
  Electrophysiology Office Note:   Date:  09/27/2023  ID:  Alvin Lang, DOB 05-28-1945, MRN [id number]  Primary Cardiologist: Lamar Fitch, MD Primary Heart Failure: None Electrophysiologist: Daun Rens Gladis Norton, MD      History of Present Illness:   Alvin Lang is a 78 y.o. male with h/o chronic systolic heart failure, hypertension, hyperlipidemia, complete heart block seen today for routine electrophysiology followup.   Since last being seen in our clinic the patient reports doing well.  He has no chest pain or shortness of breath.  He is able to do all of his daily activities without restriction.  Happy with his control..  he denies chest pain, palpitations, dyspnea, PND, orthopnea, nausea, vomiting, dizziness, syncope, edema, weight gain, or early satiety.   Review of systems complete and found to be negative unless listed in HPI.      EP Information / Studies Reviewed:    EKG is ordered today. Personal review as below.  EKG Interpretation Date/Time:  Monday September 27 2023 08:30:07 EDT Ventricular Rate:  72 PR Interval:  172 QRS Duration:  158 QT Interval:  452 QTC Calculation: 494 R Axis:   -43  Text Interpretation: AV dual-paced rhythm Biventricular pacemaker detected When compared with ECG of 01-Oct-2022 13:15, No significant change since last tracing Confirmed by Kenyanna Grzesiak ([id number]) on 09/27/2023 8:53:35 AM   PPM Interrogation-  reviewed in detail today,  See PACEART report.  Device History: Abbott BiV PPM implanted 06/26/2020 for CHB  Risk Assessment/Calculations:    CHA2DS2-VASc Score = 4   This indicates a 4.8% annual risk of stroke. The patient's score is based upon: CHF History: 1 HTN History: 1 Diabetes History: 0 Stroke History: 0 Vascular Disease History: 0 Age Score: 2 Gender Score: 0            Physical Exam:   VS:  BP (!) 110/52   Pulse 78   Ht 5' 11 (1.803 m)   Wt 185 lb 12.8 oz (84.3 kg)   SpO2 96%   BMI 25.91 kg/m    Wt  Readings from Last 3 Encounters:  09/27/23 185 lb 12.8 oz (84.3 kg)  09/27/23 185 lb 9.6 oz (84.2 kg)  04/14/23 185 lb 9.6 oz (84.2 kg)     GEN: Well nourished, well developed in no acute distress NECK: No JVD; No carotid bruits CARDIAC: Regular rate and rhythm, no murmurs, rubs, gallops RESPIRATORY:  Clear to auscultation without rales, wheezing or rhonchi  ABDOMEN: Soft, non-tender, non-distended EXTREMITIES:  No edema; No deformity   ASSESSMENT AND PLAN:    CHB s/p Abbott PPM  Normal PPM function See Pace Art report No changes today  2.  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  On medical therapy per primary cardiology.  3.  Paroxysmal atrial fibrillation: On Eliquis .  Minimally symptomatic.  No changes.  4.  Secondary hypercoagulable state: On Eliquis   5.  Hypertension:+ Well-controlled  Disposition:   Follow up with Dr. Norton in 12 months  Signed, Vidya Bamford Gladis Norton, MD

## 2023-09-27 ENCOUNTER — Encounter: Payer: Self-pay | Admitting: Cardiology

## 2023-09-27 ENCOUNTER — Ambulatory Visit (INDEPENDENT_AMBULATORY_CARE_PROVIDER_SITE_OTHER): Admitting: Cardiology

## 2023-09-27 ENCOUNTER — Ambulatory Visit: Attending: Cardiology | Admitting: Cardiology

## 2023-09-27 VITALS — BP 110/52 | HR 78 | Ht 71.0 in | Wt 185.8 lb

## 2023-09-27 VITALS — BP 110/52 | HR 78 | Ht 71.0 in | Wt 185.6 lb

## 2023-09-27 DIAGNOSIS — I442 Atrioventricular block, complete: Secondary | ICD-10-CM | POA: Diagnosis not present

## 2023-09-27 DIAGNOSIS — I420 Dilated cardiomyopathy: Secondary | ICD-10-CM | POA: Diagnosis not present

## 2023-09-27 DIAGNOSIS — I519 Heart disease, unspecified: Secondary | ICD-10-CM

## 2023-09-27 DIAGNOSIS — Z950 Presence of cardiac pacemaker: Secondary | ICD-10-CM | POA: Diagnosis not present

## 2023-09-27 DIAGNOSIS — I10 Essential (primary) hypertension: Secondary | ICD-10-CM | POA: Diagnosis not present

## 2023-09-27 DIAGNOSIS — I480 Paroxysmal atrial fibrillation: Secondary | ICD-10-CM

## 2023-09-27 DIAGNOSIS — D6869 Other thrombophilia: Secondary | ICD-10-CM

## 2023-09-27 LAB — CUP PACEART INCLINIC DEVICE CHECK
Battery Remaining Longevity: 22 mo
Battery Voltage: 2.96 V
Brady Statistic RA Percent Paced: 95 %
Brady Statistic RV Percent Paced: 99.94 %
Date Time Interrogation Session: 20250908085005
Implantable Lead Connection Status: 753985
Implantable Lead Connection Status: 753985
Implantable Lead Connection Status: 753985
Implantable Lead Implant Date: 20020201
Implantable Lead Implant Date: 20020201
Implantable Lead Implant Date: 20220608
Implantable Lead Location: 753858
Implantable Lead Location: 753859
Implantable Lead Location: 753860
Implantable Pulse Generator Implant Date: 20220608
Lead Channel Impedance Value: 237.5 Ohm
Lead Channel Impedance Value: 725 Ohm
Lead Channel Impedance Value: 737.5 Ohm
Lead Channel Pacing Threshold Amplitude: 0.5 V
Lead Channel Pacing Threshold Amplitude: 0.5 V
Lead Channel Pacing Threshold Amplitude: 1.25 V
Lead Channel Pacing Threshold Amplitude: 1.25 V
Lead Channel Pacing Threshold Amplitude: 3 V
Lead Channel Pacing Threshold Amplitude: 3 V
Lead Channel Pacing Threshold Pulse Width: 0.4 ms
Lead Channel Pacing Threshold Pulse Width: 0.4 ms
Lead Channel Pacing Threshold Pulse Width: 0.5 ms
Lead Channel Pacing Threshold Pulse Width: 0.5 ms
Lead Channel Pacing Threshold Pulse Width: 1 ms
Lead Channel Pacing Threshold Pulse Width: 1 ms
Lead Channel Sensing Intrinsic Amplitude: 12 mV
Lead Channel Sensing Intrinsic Amplitude: 2.1 mV
Lead Channel Setting Pacing Amplitude: 2 V
Lead Channel Setting Pacing Amplitude: 2.25 V
Lead Channel Setting Pacing Amplitude: 3.875
Lead Channel Setting Pacing Pulse Width: 0.5 ms
Lead Channel Setting Pacing Pulse Width: 1 ms
Lead Channel Setting Sensing Sensitivity: 4 mV
Pulse Gen Model: 3562
Pulse Gen Serial Number: 3912796

## 2023-09-27 NOTE — Patient Instructions (Signed)
 Medication Instructions:  Your physician recommends that you continue on your current medications as directed. Please refer to the Current Medication list given to you today.  *If you need a refill on your cardiac medications before your next appointment, please call your pharmacy*  Lab Work: None If you have labs (blood work) drawn today and your tests are completely normal, you will receive your results only by: MyChart Message (if you have MyChart) OR A paper copy in the mail If you have any lab test that is abnormal or we need to change your treatment, we will call you to review the results.  Testing/Procedures: None  Follow-Up: At [facility], you and your health needs are our priority.  As part of our continuing mission to provide you with exceptional heart care, our providers are all part of one team.  This team includes your primary Cardiologist (physician) and Advanced Practice Providers or APPs (Physician Assistants and Nurse Practitioners) who all work together to provide you with the care you need, when you need it.  Your next appointment:   6 month(s)  Provider:   Gypsy Balsam, MD  We recommend signing up for the patient portal called "MyChart".  Sign up information is provided on this After Visit Summary.  MyChart is used to connect with patients for Virtual Visits (Telemedicine).  Patients are able to view lab/test results, encounter notes, upcoming appointments, etc.  Non-urgent messages can be sent to your provider as well.   To learn more about what you can do with MyChart, go to [email or web address].   Other Instructions None

## 2023-09-27 NOTE — Patient Instructions (Signed)
 Medication Instructions:  Your physician recommends that you continue on your current medications as directed. Please refer to the Current Medication list given to you today.  *If you need a refill on your cardiac medications before your next appointment, please call your pharmacy*  Lab Work: None ordered  Testing/Procedures: None ordered  Follow-Up: At [facility], you and your health needs are our priority.  As part of our continuing mission to provide you with exceptional heart care, our providers are all part of one team.  This team includes your primary Cardiologist (physician) and Advanced Practice Providers or APPs (Physician Assistants and Nurse Practitioners) who all work together to provide you with the care you need, when you need it.  Remote monitoring is used to monitor your Pacemaker or ICD from home. This monitoring reduces the number of office visits required to check your device to one time per year. It allows us  to keep an eye on the functioning of your device to ensure it is working properly. You are scheduled for a device check from home on 12/22/2023. You may send your transmission at any time that day. If you have a wireless device, the transmission will be sent automatically. After your physician reviews your transmission, you will receive a postcard with your next transmission date.   Your next appointment:   1 year(s)  Provider:   Soyla Norton, MD     Thank you for choosing [facility]!!   Maeola Domino, RN [phone]

## 2023-09-27 NOTE — Progress Notes (Unsigned)
 Cardiology Office Note:    Date:  09/27/2023   ID:  BARETT WHIDBEE, DOB 05-27-1945, MRN [id number]  PCP:  Gable Cambric, MD  Cardiologist:  Lamar Fitch, MD    Referring MD: Gable Cambric, MD   No chief complaint on file.   History of Present Illness:    Alvin Lang is a 78 y.o. male  Past medical history significant for nonischemic cardiomyopathy ejection fraction as low as 25% however after putting him on guideline directed medical therapy improvement to low normal 5055%.  He also had complete heart block status post pacemaker implantation status post recent upgrade to BiV pacing.  Additional problem include dyslipidemia paroxysmal atrial fibrillation.  Comes today to my office for follow-up overall doing well denies have any chest pain tightness squeezing pressure burning chest.  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  Etc. he walks sometimes.  He gets short of breath but overall stable.   Past Medical History:  Diagnosis Date   Chronic systolic dysfunction of left ventricle    Complete heart block (HCC)    Dilated cardiomyopathy (HCC) 03/26/2016   Dyslipidemia 01/31/2015   Essential hypertension 01/31/2015   Hyperlipidemia    Hypertension    Nonischemic cardiomyopathy (HCC)    Pacemaker 05/19/2018   St.Jude   Pacemaker reprogramming/check 01/31/2015   Overview:  St jude device   Paroxysmal atrial fibrillation (HCC) 04/08/2022    Past Surgical History:  Procedure Laterality Date   BIV PACEMAKER INSERTION CRT-P N/A 06/26/2020   Procedure: BIV PACEMAKER INSERTION CRT-P;  Surgeon: Inocencio Soyla Lunger, MD;  Location: [facility];  Service: Cardiovascular;  Laterality: N/A;   PACEMAKER GENERATOR CHANGE Left 12/16/2010   SJM Accent DR RF PPM for complete heart block,  generator change by Dr Fitch   PACEMAKER IMPLANT  2002   by Dr Fitch    Current Medications: Current Meds  Medication Sig    atorvastatin  (LIPITOR) 20 MG tablet Take 1 tablet by mouth once daily   carvedilol  (COREG ) 25 MG tablet Take 1 tablet (25 mg total) by mouth 2 (two) times daily.   Multiple Vitamins-Minerals (PRESERVISION AREDS PO) Take 1 tablet by mouth daily.   omega-3 fish oil (MAXEPA) 1000 MG CAPS capsule Take 2 capsules by mouth daily.   sacubitril -valsartan  (ENTRESTO ) 49-51 MG Take 1 tablet by mouth twice daily   spironolactone  (ALDACTONE ) 25 MG tablet Take 1/2 (one-half) tablet by mouth once daily   Vitamin D, Ergocalciferol, (DRISDOL) 1.25 MG (50000 UNIT) CAPS capsule Take 50,000 Units by mouth once a week.     Allergies:   Patient has no known allergies.   Social History   Socioeconomic History   Marital status: Widowed    Spouse name: Not on file   Number of children: Not on file   Years of education: Not on file   Highest education level: Not on file  Occupational History   Not on file  Tobacco Use   Smoking status: Former   Smokeless tobacco: Never  Vaping Use   Vaping status: Never Used  Substance and Sexual Activity   Alcohol use: No   Drug use: No   Sexual activity: Not on file  Other Topics Concern   Not on file  Social History Narrative   Not on file   Social Drivers of Health   Financial Resource Strain: Not on file  Food Insecurity: Not on file  Transportation Needs: Not on file  Physical Activity: Not on file  Stress: Not on file  Social Connections: Not on file     Family History: The patient's family history includes Diabetes in his father; Heart attack in his mother; Heart disease in his father. ROS:   Please see the history of present illness.    All 14 point review of systems negative except as described per history of present illness  EKGs/Labs/Other Studies Reviewed:         Recent Labs: No results found for requested labs within last 365 days.  Recent Lipid Panel    Component Value Date/Time   CHOL 148 11/29/2019 1700   TRIG 194 (H) 11/29/2019  1700   HDL 32 (L) 11/29/2019 1700   CHOLHDL 4.6 11/29/2019 1700   LDLCALC 83 11/29/2019 1700    Physical Exam:    VS:  BP (!) 110/52   Pulse 78   Ht 5' 11 (1.803 m)   Wt 185 lb 9.6 oz (84.2 kg)   SpO2 96%   BMI 25.89 kg/m     Wt Readings from Last 3 Encounters:  09/27/23 185 lb 9.6 oz (84.2 kg)  04/14/23 185 lb 9.6 oz (84.2 kg)  10/01/22 182 lb 9.6 oz (82.8 kg)     GEN:  Well nourished, well developed in no acute distress HEENT: Normal NECK: No JVD; No carotid bruits LYMPHATICS: No lymphadenopathy CARDIAC: RRR, no murmurs, no rubs, no gallops RESPIRATORY:  Clear to auscultation without rales, wheezing or rhonchi  ABDOMEN: Soft, non-tender, non-distended MUSCULOSKELETAL:  No edema; No deformity  SKIN: Warm and dry LOWER EXTREMITIES: no swelling NEUROLOGIC:  Alert and oriented x 3 PSYCHIATRIC:  Normal affect   ASSESSMENT:    1. Dilated cardiomyopathy (HCC)   2. Paroxysmal atrial fibrillation (HCC)   3. Complete heart block (HCC)   4. Pacemaker    PLAN:    In order of problems listed above:  Dilated cardiomyopathy: Last ejection fraction lower limits of normal continue guideline directed medical therapy.  Stable compensated doing well. Paroxysmal atrial fibrillation interrogation of device show episode of atrial fibrillation in March he is asymptomatic prior episodes about a year ago.  Therefore, we will continue rate control strategy.  Continue Eliquis  5 mg twice daily.  Dose is appropriate for his to his age and weight and kidney function Dyslipidemia I did review K PN which show me his LDL 82 HDL 26.  No coronary artery disease will continue present management which include Lipitor 20. Pacemaker present Abbott device, does have appointment today with pacemaker clinic   Medication Adjustments/Labs and Tests Ordered: Current medicines are reviewed at length with the patient today.  Concerns regarding medicines are outlined above.  No orders of the defined types  were placed in this encounter.  Medication changes: No orders of the defined types were placed in this encounter.   Signed, Lamar DOROTHA Fitch, MD, [facility] 09/27/2023 8:22 AM    [facility]

## 2023-10-01 NOTE — Progress Notes (Signed)
 Remote PPM Transmission

## 2023-10-08 ENCOUNTER — Ambulatory Visit: Admitting: Cardiology

## 2023-10-09 ENCOUNTER — Other Ambulatory Visit: Payer: Self-pay | Admitting: Cardiology

## 2023-10-11 NOTE — Telephone Encounter (Signed)
 Prescription refill request for Eliquis  received. Indication:afib Last office visit:9/25 Scr:0.8  3/25 Age: 78 Weight:84.3  kg  Prescription refilled

## 2023-10-14 ENCOUNTER — Encounter: Payer: Self-pay | Admitting: Internal Medicine

## 2023-10-19 ENCOUNTER — Other Ambulatory Visit: Payer: Self-pay | Admitting: Cardiology

## 2023-10-21 DIAGNOSIS — R739 Hyperglycemia, unspecified: Secondary | ICD-10-CM | POA: Diagnosis not present

## 2023-10-21 DIAGNOSIS — I429 Cardiomyopathy, unspecified: Secondary | ICD-10-CM | POA: Diagnosis not present

## 2023-10-21 DIAGNOSIS — I10 Essential (primary) hypertension: Secondary | ICD-10-CM | POA: Diagnosis not present

## 2023-10-21 DIAGNOSIS — Z23 Encounter for immunization: Secondary | ICD-10-CM | POA: Diagnosis not present

## 2023-10-21 DIAGNOSIS — M1990 Unspecified osteoarthritis, unspecified site: Secondary | ICD-10-CM | POA: Diagnosis not present

## 2023-10-21 DIAGNOSIS — E785 Hyperlipidemia, unspecified: Secondary | ICD-10-CM | POA: Diagnosis not present

## 2023-10-21 DIAGNOSIS — D696 Thrombocytopenia, unspecified: Secondary | ICD-10-CM | POA: Diagnosis not present

## 2023-10-21 DIAGNOSIS — Z6825 Body mass index (BMI) 25.0-25.9, adult: Secondary | ICD-10-CM | POA: Diagnosis not present

## 2023-10-21 DIAGNOSIS — E663 Overweight: Secondary | ICD-10-CM | POA: Diagnosis not present

## 2023-10-21 DIAGNOSIS — Z79899 Other long term (current) drug therapy: Secondary | ICD-10-CM | POA: Diagnosis not present

## 2023-10-21 DIAGNOSIS — E559 Vitamin D deficiency, unspecified: Secondary | ICD-10-CM | POA: Diagnosis not present

## 2023-11-24 DIAGNOSIS — H353132 Nonexudative age-related macular degeneration, bilateral, intermediate dry stage: Secondary | ICD-10-CM | POA: Diagnosis not present

## 2023-12-22 ENCOUNTER — Ambulatory Visit

## 2023-12-22 DIAGNOSIS — I420 Dilated cardiomyopathy: Secondary | ICD-10-CM | POA: Diagnosis not present

## 2023-12-23 LAB — CUP PACEART REMOTE DEVICE CHECK
Battery Remaining Longevity: 22 mo
Battery Remaining Percentage: 38 %
Battery Voltage: 2.95 V
Brady Statistic AP VP Percent: 94 %
Brady Statistic AP VS Percent: 1 %
Brady Statistic AS VP Percent: 4.5 %
Brady Statistic AS VS Percent: 1 %
Brady Statistic RA Percent Paced: 93 %
Date Time Interrogation Session: 20251203044658
Implantable Lead Connection Status: 753985
Implantable Lead Connection Status: 753985
Implantable Lead Connection Status: 753985
Implantable Lead Implant Date: 20020201
Implantable Lead Implant Date: 20020201
Implantable Lead Implant Date: 20220608
Implantable Lead Location: 753858
Implantable Lead Location: 753859
Implantable Lead Location: 753860
Implantable Pulse Generator Implant Date: 20220608
Lead Channel Impedance Value: 240 Ohm
Lead Channel Impedance Value: 640 Ohm
Lead Channel Impedance Value: 760 Ohm
Lead Channel Pacing Threshold Amplitude: 0.625 V
Lead Channel Pacing Threshold Amplitude: 1.25 V
Lead Channel Pacing Threshold Amplitude: 2.25 V
Lead Channel Pacing Threshold Pulse Width: 0.4 ms
Lead Channel Pacing Threshold Pulse Width: 0.5 ms
Lead Channel Pacing Threshold Pulse Width: 1 ms
Lead Channel Sensing Intrinsic Amplitude: 12 mV
Lead Channel Sensing Intrinsic Amplitude: 2.3 mV
Lead Channel Setting Pacing Amplitude: 2 V
Lead Channel Setting Pacing Amplitude: 2.25 V
Lead Channel Setting Pacing Amplitude: 3.25 V
Lead Channel Setting Pacing Pulse Width: 0.5 ms
Lead Channel Setting Pacing Pulse Width: 1 ms
Lead Channel Setting Sensing Sensitivity: 4 mV
Pulse Gen Model: 3562
Pulse Gen Serial Number: 3912796

## 2023-12-29 NOTE — Progress Notes (Signed)
 Remote PPM Transmission

## 2023-12-30 ENCOUNTER — Ambulatory Visit: Payer: Self-pay | Admitting: Cardiology

## 2024-02-20 ENCOUNTER — Other Ambulatory Visit: Payer: Self-pay | Admitting: Cardiology

## 2024-03-22 ENCOUNTER — Ambulatory Visit

## 2024-03-29 ENCOUNTER — Ambulatory Visit: Admitting: Cardiology

## 2024-06-21 ENCOUNTER — Ambulatory Visit

## 2024-09-20 ENCOUNTER — Ambulatory Visit
